# Patient Record
Sex: Male | Born: 1970 | Race: White | Hispanic: No | Marital: Married | Smoking: Never smoker
Health system: Southern US, Community
[De-identification: ages and names within clinical notes are randomized; demographics above are authoritative.]

## PROBLEM LIST (undated history)

## (undated) DIAGNOSIS — K219 Gastro-esophageal reflux disease without esophagitis: Secondary | ICD-10-CM

## (undated) DIAGNOSIS — L989 Disorder of the skin and subcutaneous tissue, unspecified: Secondary | ICD-10-CM

## (undated) DIAGNOSIS — R05 Cough: Secondary | ICD-10-CM

## (undated) DIAGNOSIS — M549 Dorsalgia, unspecified: Secondary | ICD-10-CM

## (undated) DIAGNOSIS — B351 Tinea unguium: Secondary | ICD-10-CM

## (undated) DIAGNOSIS — R062 Wheezing: Secondary | ICD-10-CM

## (undated) DIAGNOSIS — J309 Allergic rhinitis, unspecified: Secondary | ICD-10-CM

## (undated) DIAGNOSIS — H9190 Unspecified hearing loss, unspecified ear: Secondary | ICD-10-CM

## (undated) DIAGNOSIS — B029 Zoster without complications: Secondary | ICD-10-CM

## (undated) DIAGNOSIS — K5289 Other specified noninfective gastroenteritis and colitis: Secondary | ICD-10-CM

## (undated) DIAGNOSIS — R300 Dysuria: Secondary | ICD-10-CM

## (undated) HISTORY — DX: Dorsalgia, unspecified: M54.9

## (undated) HISTORY — DX: Unspecified hearing loss, unspecified ear: H91.90

## (undated) HISTORY — DX: Dysuria: R30.0

## (undated) HISTORY — DX: Tinea unguium: B35.1

## (undated) HISTORY — DX: Cough: R05

## (undated) HISTORY — DX: Allergic rhinitis, unspecified: J30.9

## (undated) HISTORY — DX: Disorder of the skin and subcutaneous tissue, unspecified: L98.9

## (undated) HISTORY — DX: Other specified noninfective gastroenteritis and colitis: K52.89

## (undated) HISTORY — DX: Wheezing: R06.2

## (undated) HISTORY — DX: Gastro-esophageal reflux disease without esophagitis: K21.9

## (undated) HISTORY — DX: Zoster without complications: B02.9

## (undated) HISTORY — PX: OTHER SURGICAL HISTORY: SHX169

---

## 1999-01-11 ENCOUNTER — Emergency Department (HOSPITAL_COMMUNITY): Admission: EM | Admit: 1999-01-11 | Discharge: 1999-01-11 | Payer: Self-pay

## 2007-08-23 ENCOUNTER — Ambulatory Visit: Payer: Self-pay | Admitting: Internal Medicine

## 2007-08-23 LAB — CONVERTED CEMR LAB
ALT: 26 units/L (ref 0–53)
Albumin: 3.7 g/dL (ref 3.5–5.2)
Alkaline Phosphatase: 90 units/L (ref 39–117)
BUN: 11 mg/dL (ref 6–23)
Basophils Absolute: 0 10*3/uL (ref 0.0–0.1)
Basophils Relative: 0.6 % (ref 0.0–1.0)
Calcium: 8.7 mg/dL (ref 8.4–10.5)
Cholesterol: 153 mg/dL (ref 0–200)
Creatinine, Ser: 1 mg/dL (ref 0.4–1.5)
GFR calc Af Amer: 109 mL/min
HDL: 25.6 mg/dL — ABNORMAL LOW (ref >39.0)
Hemoglobin: 15.5 g/dL (ref 13.0–17.0)
LDL Cholesterol: 90 mg/dL (ref 0–99)
MCHC: 34.3 g/dL (ref 30.0–36.0)
Monocytes Absolute: 0.5 10*3/uL (ref 0.2–0.7)
Monocytes Relative: 9 % (ref 3.0–11.0)
Platelets: 211 10*3/uL (ref 150–400)
Potassium: 4.4 meq/L (ref 3.5–5.1)
RBC: 5.04 M/uL (ref 4.22–5.81)
RDW: 12.5 % (ref 11.5–14.6)
TSH: 2.41 microintl units/mL (ref 0.35–5.50)
Total Bilirubin: 1 mg/dL (ref 0.3–1.2)
Total CHOL/HDL Ratio: 6
Triglycerides: 188 mg/dL — ABNORMAL HIGH (ref 0–149)
VLDL: 38 mg/dL (ref 0–40)

## 2007-08-29 ENCOUNTER — Encounter: Payer: Self-pay | Admitting: Internal Medicine

## 2007-08-29 ENCOUNTER — Ambulatory Visit: Payer: Self-pay | Admitting: Internal Medicine

## 2007-08-29 DIAGNOSIS — J309 Allergic rhinitis, unspecified: Secondary | ICD-10-CM

## 2007-08-29 DIAGNOSIS — K219 Gastro-esophageal reflux disease without esophagitis: Secondary | ICD-10-CM

## 2007-08-29 DIAGNOSIS — L0501 Pilonidal cyst with abscess: Secondary | ICD-10-CM

## 2007-08-29 HISTORY — DX: Gastro-esophageal reflux disease without esophagitis: K21.9

## 2007-08-29 HISTORY — DX: Allergic rhinitis, unspecified: J30.9

## 2007-12-05 ENCOUNTER — Telehealth: Payer: Self-pay | Admitting: Internal Medicine

## 2007-12-06 ENCOUNTER — Ambulatory Visit: Payer: Self-pay | Admitting: Internal Medicine

## 2007-12-26 ENCOUNTER — Ambulatory Visit: Payer: Self-pay | Admitting: Internal Medicine

## 2007-12-29 ENCOUNTER — Ambulatory Visit: Payer: Self-pay | Admitting: Internal Medicine

## 2008-01-10 ENCOUNTER — Telehealth (INDEPENDENT_AMBULATORY_CARE_PROVIDER_SITE_OTHER): Payer: Self-pay | Admitting: *Deleted

## 2008-01-11 ENCOUNTER — Encounter (INDEPENDENT_AMBULATORY_CARE_PROVIDER_SITE_OTHER): Payer: Self-pay | Admitting: *Deleted

## 2008-01-27 ENCOUNTER — Ambulatory Visit: Payer: Self-pay | Admitting: Pulmonary Disease

## 2008-01-27 DIAGNOSIS — R05 Cough: Secondary | ICD-10-CM

## 2008-01-27 DIAGNOSIS — R059 Cough, unspecified: Secondary | ICD-10-CM | POA: Insufficient documentation

## 2008-02-10 ENCOUNTER — Ambulatory Visit: Payer: Self-pay | Admitting: Pulmonary Disease

## 2008-05-10 ENCOUNTER — Ambulatory Visit: Payer: Self-pay | Admitting: Internal Medicine

## 2008-05-10 DIAGNOSIS — H9190 Unspecified hearing loss, unspecified ear: Secondary | ICD-10-CM | POA: Insufficient documentation

## 2008-05-10 HISTORY — DX: Unspecified hearing loss, unspecified ear: H91.90

## 2008-07-12 ENCOUNTER — Telehealth (INDEPENDENT_AMBULATORY_CARE_PROVIDER_SITE_OTHER): Payer: Self-pay | Admitting: *Deleted

## 2008-08-29 ENCOUNTER — Encounter: Payer: Self-pay | Admitting: Internal Medicine

## 2008-09-14 ENCOUNTER — Ambulatory Visit: Payer: Self-pay | Admitting: Internal Medicine

## 2008-09-14 DIAGNOSIS — B351 Tinea unguium: Secondary | ICD-10-CM

## 2008-09-14 DIAGNOSIS — R1031 Right lower quadrant pain: Secondary | ICD-10-CM

## 2008-09-14 HISTORY — DX: Tinea unguium: B35.1

## 2008-09-17 ENCOUNTER — Telehealth (INDEPENDENT_AMBULATORY_CARE_PROVIDER_SITE_OTHER): Payer: Self-pay | Admitting: *Deleted

## 2008-09-25 ENCOUNTER — Encounter: Payer: Self-pay | Admitting: Internal Medicine

## 2009-01-08 ENCOUNTER — Ambulatory Visit: Payer: Self-pay | Admitting: Internal Medicine

## 2009-01-08 DIAGNOSIS — M549 Dorsalgia, unspecified: Secondary | ICD-10-CM

## 2009-01-08 DIAGNOSIS — K5289 Other specified noninfective gastroenteritis and colitis: Secondary | ICD-10-CM

## 2009-01-08 HISTORY — DX: Dorsalgia, unspecified: M54.9

## 2009-01-08 HISTORY — DX: Other specified noninfective gastroenteritis and colitis: K52.89

## 2009-01-08 LAB — CONVERTED CEMR LAB
Leukocytes, UA: NEGATIVE
Nitrite: NEGATIVE
Specific Gravity, Urine: >=1.03 (ref 1.000–1.03)
Squamous Epithelial / LPF: NEGATIVE /lpf
Total Protein, Urine: 30 mg/dL — AB
pH: 5.5 (ref 5.0–8.0)

## 2009-01-09 ENCOUNTER — Encounter: Payer: Self-pay | Admitting: Internal Medicine

## 2009-08-01 ENCOUNTER — Encounter: Payer: Self-pay | Admitting: Internal Medicine

## 2009-08-12 ENCOUNTER — Ambulatory Visit: Payer: Self-pay | Admitting: Internal Medicine

## 2009-08-12 LAB — CONVERTED CEMR LAB
Albumin: 3.8 g/dL (ref 3.5–5.2)
Basophils Absolute: 0 10*3/uL (ref 0.0–0.1)
Bilirubin, Direct: 0.1 mg/dL (ref 0.0–0.3)
Chloride: 111 meq/L (ref 96–112)
Cholesterol: 149 mg/dL (ref 0–200)
Eosinophils Absolute: 0.2 10*3/uL (ref 0.0–0.7)
HDL: 27.1 mg/dL — ABNORMAL LOW (ref >39.00)
Hemoglobin: 15.6 g/dL (ref 13.0–17.0)
LDL Cholesterol: 104 mg/dL — ABNORMAL HIGH (ref 0–99)
Leukocytes, UA: NEGATIVE
Lymphocytes Relative: 32 % (ref 12.0–46.0)
MCHC: 33.9 g/dL (ref 30.0–36.0)
Neutro Abs: 3.3 10*3/uL (ref 1.4–7.7)
Neutrophils Relative %: 53.9 % (ref 43.0–77.0)
Platelets: 192 10*3/uL (ref 150.0–400.0)
Potassium: 4.2 meq/L (ref 3.5–5.1)
RDW: 12.3 % (ref 11.5–14.6)
Sodium: 142 meq/L (ref 135–145)
Specific Gravity, Urine: 1.02 (ref 1.000–1.030)
Total Protein: 6.8 g/dL (ref 6.0–8.3)
Triglycerides: 91 mg/dL (ref 0.0–149.0)
Urine Glucose: NEGATIVE mg/dL
Urobilinogen, UA: 0.2 (ref 0.0–1.0)
VLDL: 18.2 mg/dL (ref 0.0–40.0)

## 2009-08-16 ENCOUNTER — Ambulatory Visit: Payer: Self-pay | Admitting: Internal Medicine

## 2009-10-22 ENCOUNTER — Ambulatory Visit: Payer: Self-pay | Admitting: Internal Medicine

## 2009-10-22 DIAGNOSIS — R062 Wheezing: Secondary | ICD-10-CM | POA: Insufficient documentation

## 2009-10-22 LAB — CONVERTED CEMR LAB
Cholesterol, target level: 200 mg/dL
LDL Goal: 160 mg/dL

## 2010-02-26 IMAGING — CR DG CHEST 2V
2 series · 2 of 2 positions shown · non-contrast
Comparison: 12/29/2007

CLINICAL DATA: Cough.  Wheezing.  Short of breath.

CHEST - 2 VIEW

[view not recorded (1 of 2)]
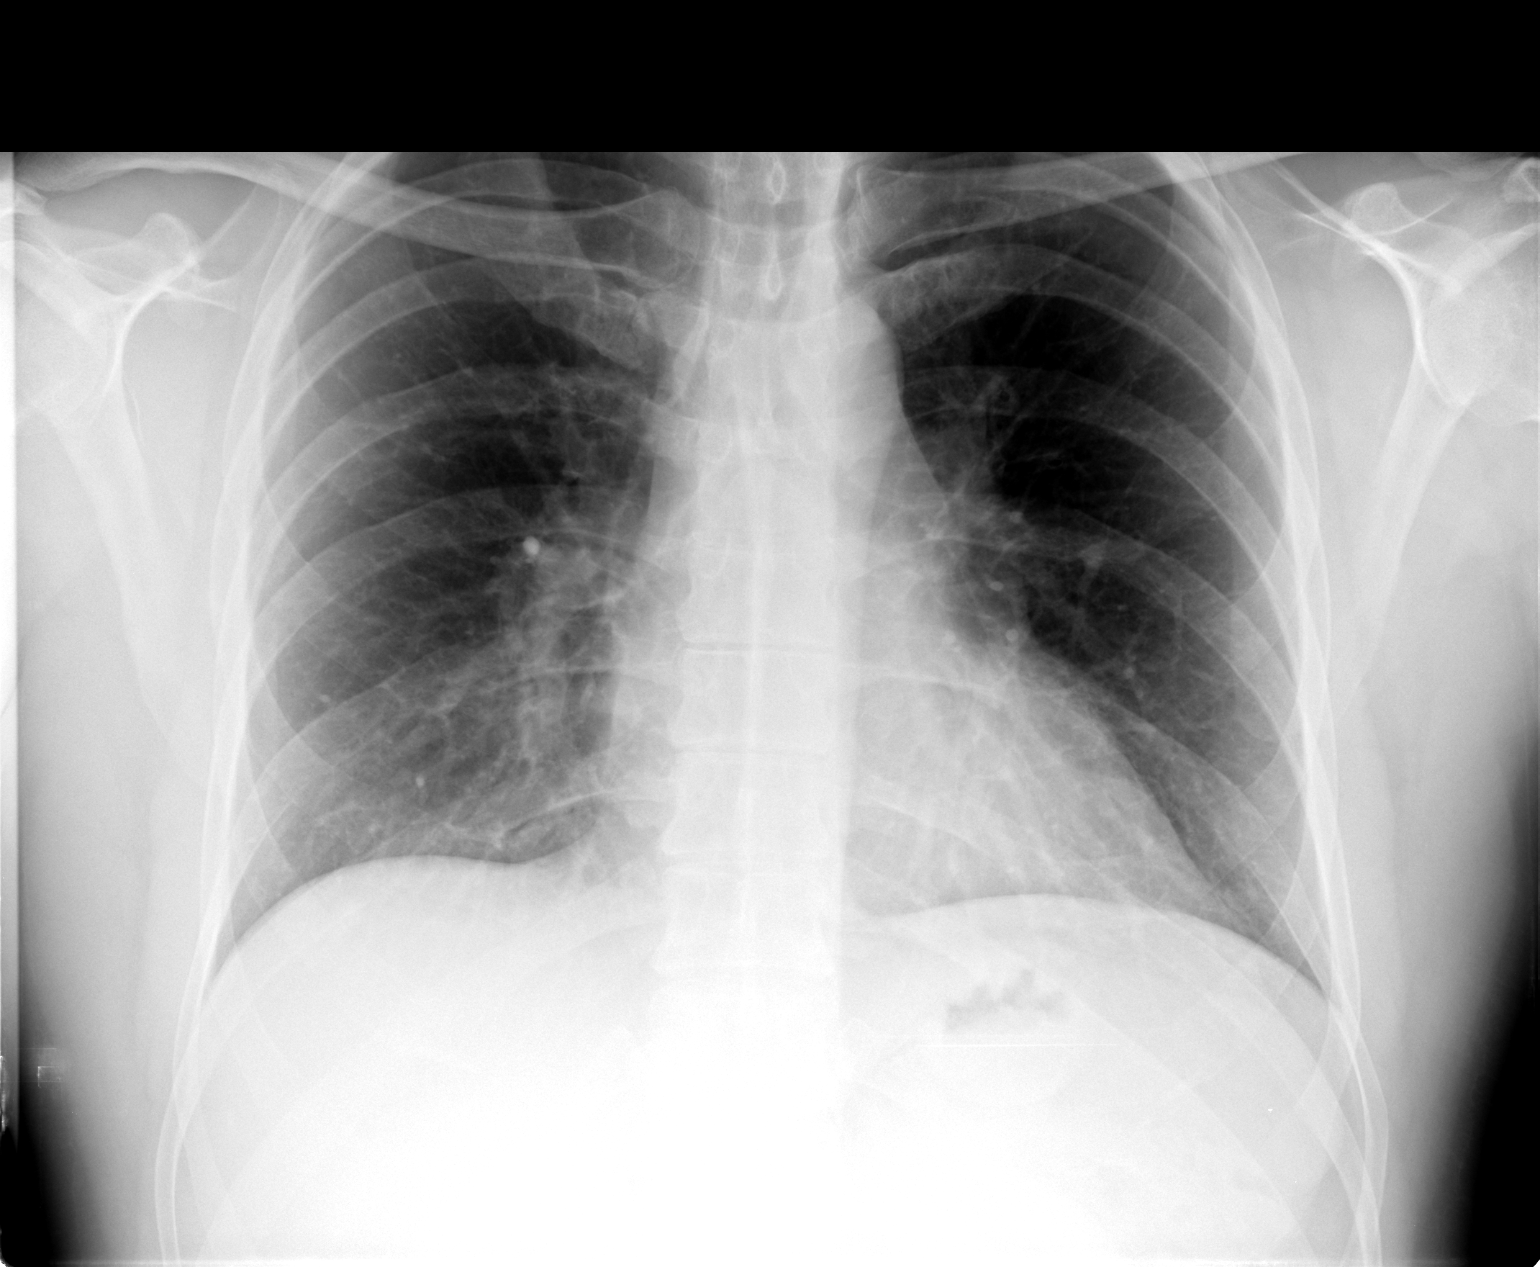

[view not recorded (2 of 2)]
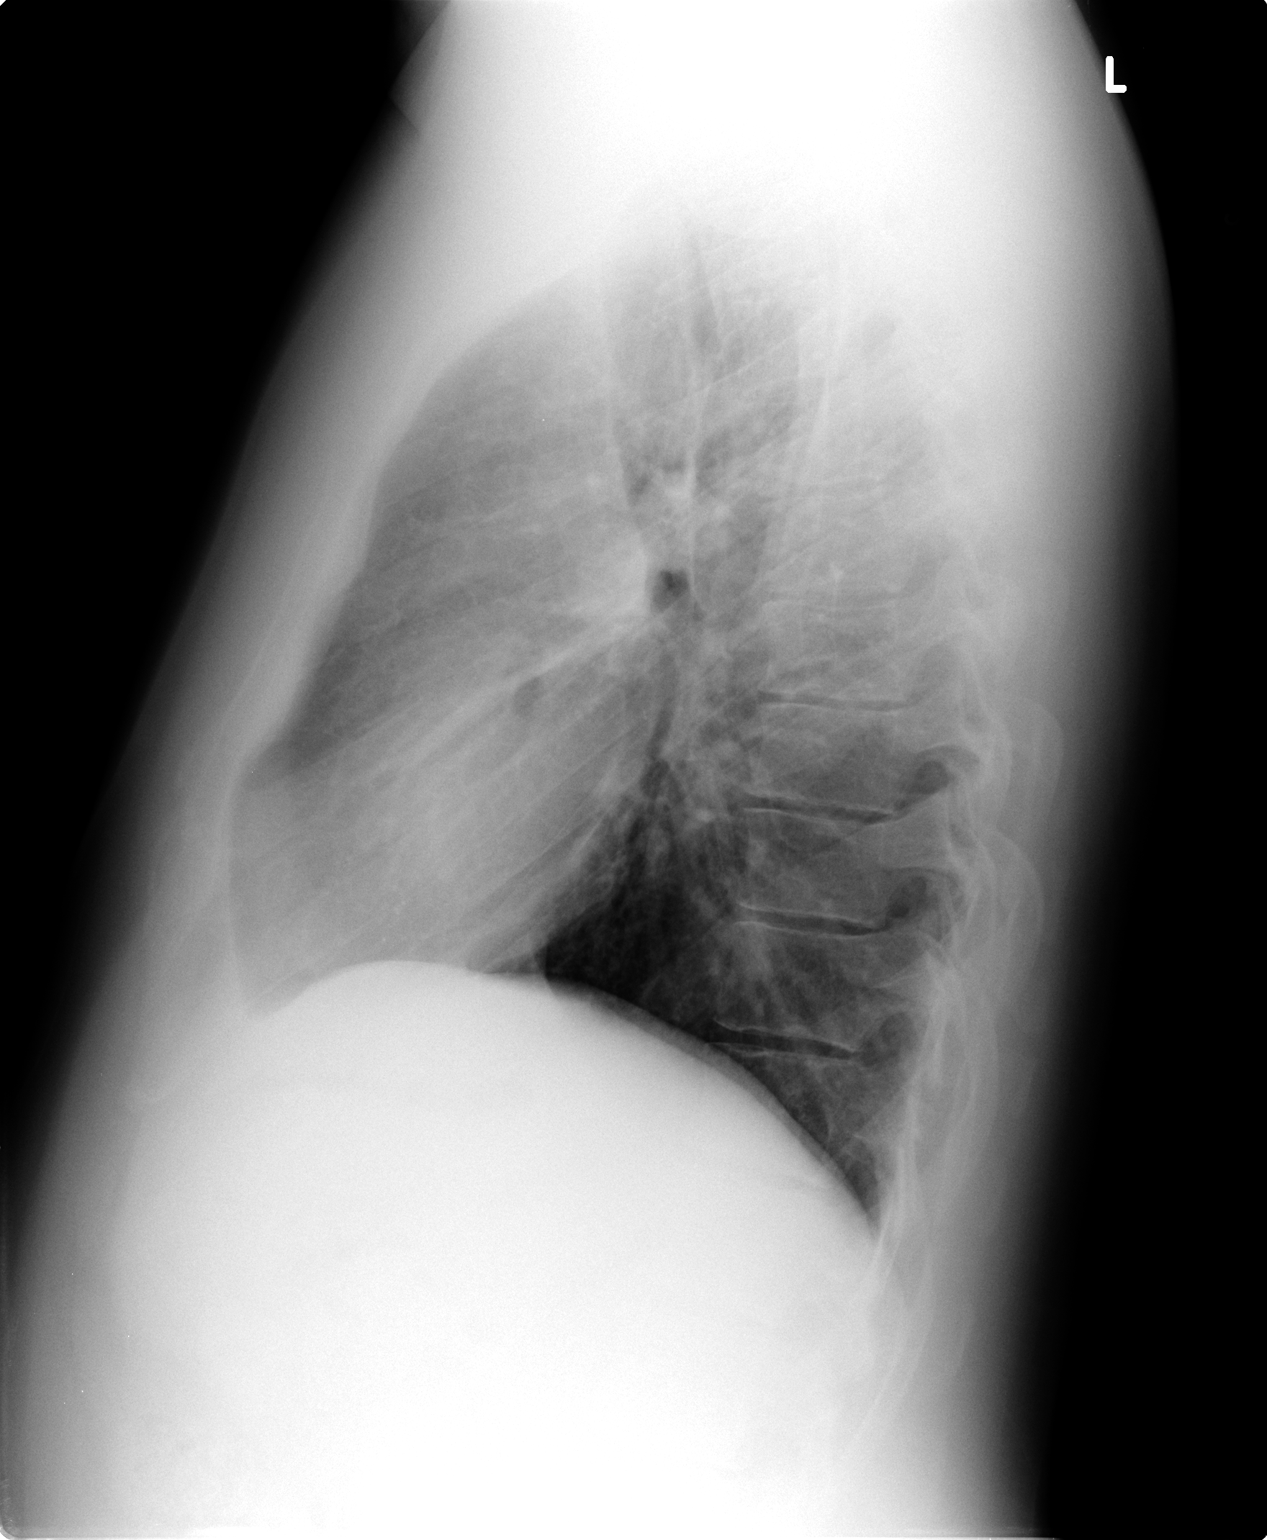

[2 of 2 positions shown; findings below may reference images not displayed]

FINDINGS: The heart size and mediastinal contours are within
normal limits.  Both lungs are clear.  The visualized skeletal
structures are unremarkable.
IMPRESSION: No active cardiopulmonary disease.

## 2010-10-28 ENCOUNTER — Ambulatory Visit: Payer: Self-pay | Admitting: Internal Medicine

## 2010-10-28 DIAGNOSIS — L989 Disorder of the skin and subcutaneous tissue, unspecified: Secondary | ICD-10-CM | POA: Insufficient documentation

## 2010-10-28 HISTORY — DX: Disorder of the skin and subcutaneous tissue, unspecified: L98.9

## 2010-11-24 ENCOUNTER — Ambulatory Visit
Admission: RE | Admit: 2010-11-24 | Discharge: 2010-11-24 | Payer: Self-pay | Source: Home / Self Care | Attending: Internal Medicine | Admitting: Internal Medicine

## 2010-12-18 NOTE — Assessment & Plan Note (Signed)
Summary: last ov 2010,?skin tag,irriation under arm/cd   Vital Signs:  Patient profile:   40 year old male Height:      68 inches Weight:      200 pounds BMI:     30.52 O2 Sat:      97 % on Room air Temp:     97.7 degrees F oral Pulse rate:   57 / minute BP sitting:   100 / 70  (left arm) Cuff size:   regular  Vitals Entered By: Zella Ball Ewing CMA Duncan Dull) (October 28, 2010 11:18 AM)  O2 Flow:  Room air CC: Spot under right arm red and painful/RE   Primary Care Provider:  Oliver Barre  CC:  Spot under right arm red and painful/RE.  History of Present Illness: here with acute onset 3 days pain, red, swelling to cystic type mass to the right axilla, no fever, drainage and no chills.  Area is very small, no prior hx of same, and no other similar areas today.  Pt denies CP, worsening sob, doe, wheezing, orthopnea, pnd, worsening LE edema, palps, dizziness or syncope  Pt denies polydipsia, polyuria.  Overall good compliance with meds.  Preventive Screening-Counseling & Management      Drug Use:  no.    Problems Prior to Update: 1)  Skin Lesion  (ICD-709.9) 2)  Wheezing  (ICD-786.07) 3)  Preventive Health Care  (ICD-V70.0) 4)  Back Pain  (ICD-724.5) 5)  Gastroenteritis, Acute  (ICD-558.9) 6)  Hepatotoxicity, Drug-induced, Risk of  (ICD-V58.69) 7)  Onychomycosis, Toenails  (ICD-110.1) 8)  Rlq Pain  (ICD-789.03) 9)  Hearing Loss  (ICD-389.9) 10)  Cough  (ICD-786.2) 11)  Preventive Health Care  (ICD-V70.0) 12)  Family History Depression  (ICD-V17.0) 13)  Family History of Cad Male 1st Degree Relative <50  (ICD-V17.3) 14)  Cyst, Pilonidal W/abscess  (ICD-685.0) 15)  Allergic Rhinitis  (ICD-477.9) 16)  Gerd  (ICD-530.81)  Medications Prior to Update: 1)  Promethazine Hcl 25 Mg Tabs (Promethazine Hcl) .Marland Kitchen.. 1 By Mouth Q 6 Hrs As Needed Nausea 2)  Azithromycin 250 Mg Tabs (Azithromycin) .... 2po Qd For 1 Day, Then 1po Qd For 4days, Then Stop 3)  Hydrocodone-Homatropine 5-1.5 Mg/48ml  Syrp (Hydrocodone-Homatropine) .Marland Kitchen.. 1 Tsp By Mouth Q 6 Hrs As Needed 4)  Prednisone 10 Mg Tabs (Prednisone) .... 3po Qd For 3days, Then 2po Qd For 3days, Then 1po Qd For 3days, Then Stop 5)  Fexofenadine Hcl 180 Mg Tabs (Fexofenadine Hcl) .Marland Kitchen.. 1 By Mouth Once Daily As Needed Allergy  Current Medications (verified): 1)  Fexofenadine Hcl 180 Mg Tabs (Fexofenadine Hcl) .Marland Kitchen.. 1 By Mouth Once Daily As Needed Allergy 2)  Doxycycline Hyclate 100 Mg Caps (Doxycycline Hyclate) .Marland Kitchen.. 1 By Mouth Two Times A Day  Allergies (verified): No Known Drug Allergies  Past History:  Past Medical History: Last updated: 05/10/2008 CYST, PILONIDAL W/ABSCESS (ICD-685.0) ALLERGIC RHINITIS (ICD-477.9) GERD (ICD-530.81)    Past Surgical History: Last updated: 05/10/2008 pilonidal cystectomy  Social History: Last updated: 10/28/2010 Never Smoked Alcohol use-yes pt is married. 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  no  ill appearing Head:  normocephalic and atraumatic.   Eyes:  vision grossly intact, pupils equal, and pupils round.   Ears:  R ear normal and L ear normal.   Nose:  no external deformity and no nasal discharge.   Mouth:  no gingival abnormalities and pharynx pink and moist.   Neck:  supple and no masses.   Lungs:  normal respiratory effort and normal breath sounds.   Heart:  normal rate and regular rhythm.   Extremities:  no edema, no erythema  Axillary Nodes:  right axilla with subq nodular mass, mild tender, nonfluctuant, no drainage with mild surrounding swelling/erythema < .5 cm   Impression & Recommendations:  Problem #  1:  SKIN LESION (ICD-709.9) Assessment Deteriorated skin tag with assoc cystic mass subq - ? inflamed/infected - for doxy course, refer derm  Orders: Dermatology Referral (Derma)  Complete Medication List: 1)  Fexofenadine Hcl 180 Mg Tabs (Fexofenadine hcl) .Marland Kitchen.. 1 by mouth once daily as needed allergy 2)  Doxycycline Hyclate 100 Mg Caps (Doxycycline hyclate) .Marland Kitchen.. 1 by mouth two times a day  Patient Instructions: 1)  Please take all new medications as prescribed 2)  Continue all previous medications as before this visit  3)  You will be contacted about the referral(s) to: dermatology 4)  Please schedule a follow-up appointment as needed. Prescriptions: DOXYCYCLINE HYCLATE 100 MG CAPS (DOXYCYCLINE HYCLATE) 1 by mouth two times a day  #20 x 0   Entered and Authorized by:   Corwin Levins MD   Signed by:   Corwin Levins MD on 10/28/2010   Method used:   Print then Give to Patient   RxID:   [id number]    Orders Added: 1)  Dermatology Referral [Derma] 2)  Est. Patient Level III [69629]

## 2010-12-18 NOTE — Assessment & Plan Note (Signed)
Summary: COUGH--BREATH'G DIFFICULT  ---STC   Vital Signs:  Patient profile:   40 year old male Height:      68 inches Weight:      203.50 pounds BMI:     31.05 O2 Sat:      97 % on Room air Temp:     98.4 degrees F oral Pulse rate:   76 / minute BP sitting:   130 / 82  (left arm) Cuff size:   large  Vitals Entered By: Zella Ball Ewing CMA (AAMA) (November 24, 2010 2:03 PM)  O2 Flow:  Room air CC: Cough, Difficult to Breathe/RE   Primary Care Provider:  Oliver Barre  CC:  Cough and Difficult to Breathe/RE.  History of Present Illness: here with acute onset 2-3 days fever, headache, mild ST, non prod cough and mild wheezing/sib;  Pt denies CP, doe, orthopnea, pnd, worsening LE edema, palps, dizziness or syncope  Pt denies new neuro symptoms such as facial or extremity weakness . Pt denies polydipsia, polyuria   Overall good compliance with meds, trying to follow low chol  diet, wt stable, little excercise however  No recent wt loss, night sweats, loss of appetite or other constitutional symptoms .  Overall good compliance with meds, and good tolerability. Has stable allergy symptoms on current meds without signficant itch, sneeze or congesiton.  No sinus pain , pressure or d/c.  Has occasional GERD symptoms with sour brash, but overall mild, some due to dietary indiscretion, but no dysphagai, wt loss, abd pain, bowel change or blood.   Problems Prior to Update: 1)  Wheezing  (ICD-786.07) 2)  Bronchitis-acute  (ICD-466.0) 3)  Skin Lesion  (ICD-709.9) 4)  Wheezing  (ICD-786.07) 5)  Preventive Health Care  (ICD-V70.0) 6)  Back Pain  (ICD-724.5) 7)  Gastroenteritis, Acute  (ICD-558.9) 8)  Hepatotoxicity, Drug-induced, Risk of  (ICD-V58.69) 9)  Onychomycosis, Toenails  (ICD-110.1) 10)  Rlq Pain  (ICD-789.03) 11)  Hearing Loss  (ICD-389.9) 12)  Cough  (ICD-786.2) 13)  Preventive Health Care  (ICD-V70.0) 14)  Family History Depression  (ICD-V17.0) 15)  Family History of Cad Male 1st Degree  Relative <50  (ICD-V17.3) 16)  Cyst, Pilonidal W/abscess  (ICD-685.0) 17)  Allergic Rhinitis  (ICD-477.9) 18)  Gerd  (ICD-530.81)  Medications Prior to Update: 1)  Fexofenadine Hcl 180 Mg Tabs (Fexofenadine Hcl) .Marland Kitchen.. 1 By Mouth Once Daily As Needed Allergy 2)  Doxycycline Hyclate 100 Mg Caps (Doxycycline Hyclate) .Marland Kitchen.. 1 By Mouth Two Times A Day  Current Medications (verified): 1)  Fexofenadine Hcl 180 Mg Tabs (Fexofenadine Hcl) .Marland Kitchen.. 1 By Mouth Once Daily As Needed Allergy 2)  Azithromycin 250 Mg Tabs (Azithromycin) .... 2po Qd For 1 Day, Then 1po Qd For 4days, Then Stop 3)  Prednisone 10 Mg Tabs (Prednisone) .... 3po Qd For 3days, Then 2po Qd For 3days, Then 1po Qd For 3days, Then Stop 4)  Tussionex Pennkinetic Er 10-8 Mg/6ml Lqcr (Hydrocod Polst-Chlorphen Polst) .Marland Kitchen.. 1 Tsp By Mouth Two Times A Day As Needed  Allergies (verified): No Known Drug Allergies  Past History:  Past Medical History: Last updated: 05/10/2008 CYST, PILONIDAL W/ABSCESS (ICD-685.0) ALLERGIC RHINITIS (ICD-477.9) GERD (ICD-530.81)    Past Surgical History: Last updated: 05/10/2008 pilonidal cystectomy  Social History: Last updated: 10/28/2010 Never Smoked Alcohol use-yes pt is married. 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  mild ill appearing Head:  normocephalic and atraumatic.   Eyes:  vision grossly intact, pupils equal, and pupils round.   Ears:  bilat tm's red, sinus tender Nose:  nasal dischargemucosal pallor and mucosal edema.   Mouth:  pharyngeal erythema and fair dentition.   Neck:  supple and cervical lymphadenopathy.   Lungs:  normal respiratory effort, R decreased breath sounds, R wheezes, L decreased breath sounds, and L wheezes.   Heart:  normal rate and regular rhythm.     Abdomen:  soft, non-tender, and normal bowel sounds.   Extremities:  no edema, no erythema    Impression & Recommendations:  Problem # 1:  BRONCHITIS-ACUTE (ICD-466.0)  His updated medication list for this problem includes:    Azithromycin 250 Mg Tabs (Azithromycin) .Marland Kitchen... 2po qd for 1 day, then 1po qd for 4days, then stop    Tussionex Pennkinetic Er 10-8 Mg/8ml Lqcr (Hydrocod polst-chlorphen polst) .Marland Kitchen... 1 tsp by mouth two times a day as needed treat as above, f/u any worsening signs or symptoms   Problem # 2:  WHEEZING (ICD-786.07) mild, likely due to bronchospasm related to #1, for predpack for home; f/u any worsening symptoms  Problem # 3:  ALLERGIC RHINITIS (ICD-477.9)  His updated medication list for this problem includes:    Fexofenadine Hcl 180 Mg Tabs (Fexofenadine hcl) .Marland Kitchen... 1 by mouth once daily as needed allergy stable overall by hx and exam, ok to continue meds/tx as is   Problem # 4:  GERD (ICD-530.81) mild symptomatic, declines rx PPI, prefers anti-refux precautions and oTC PPI as needed   Complete Medication List: 1)  Fexofenadine Hcl 180 Mg Tabs (Fexofenadine hcl) .Marland Kitchen.. 1 by mouth once daily as needed allergy 2)  Azithromycin 250 Mg Tabs (Azithromycin) .... 2po qd for 1 day, then 1po qd for 4days, then stop 3)  Prednisone 10 Mg Tabs (Prednisone) .... 3po qd for 3days, then 2po qd for 3days, then 1po qd for 3days, then stop 4)  Tussionex Pennkinetic Er 10-8 Mg/31ml Lqcr (Hydrocod polst-chlorphen polst) .Marland Kitchen.. 1 tsp by mouth two times a day as needed  Patient Instructions: 1)  Please take all new medications as prescribed 2)  Continue all previous medications as before this visit  3)  Please schedule a follow-up appointment in Oct 2012 for CPX with labs Prescriptions: TUSSIONEX PENNKINETIC ER 10-8 MG/5ML LQCR (HYDROCOD POLST-CHLORPHEN POLST) 1 tsp by mouth two times a day as needed  #6oz x 1   Entered and Authorized by:   Corwin Levins MD   Signed by:   Corwin Levins  MD on 11/24/2010   Method used:   Print then Give to Patient   RxID:   [id number] PREDNISONE 10 MG TABS (PREDNISONE) 3po qd for 3days, then 2po qd for 3days, then 1po qd for 3days, then stop  #18 x 0   Entered and Authorized by:   Corwin Levins MD   Signed by:   Corwin Levins MD on 11/24/2010   Method used:   Print then Give to Patient   RxID:   [id number] AZITHROMYCIN 250 MG TABS (AZITHROMYCIN) 2po qd for 1 day, then 1po qd for 4days, then stop  #6 x 1   Entered and Authorized by:   Corwin Levins MD   Signed by:   Corwin Levins MD on 11/24/2010   Method used:   Print then Give  to Patient   RxID:   [id number]    Orders Added: 1)  Est. Patient Level IV [14782]

## 2011-01-19 ENCOUNTER — Other Ambulatory Visit: Payer: Self-pay | Admitting: Internal Medicine

## 2011-01-19 ENCOUNTER — Ambulatory Visit (INDEPENDENT_AMBULATORY_CARE_PROVIDER_SITE_OTHER): Payer: BC Managed Care – PPO | Admitting: Internal Medicine

## 2011-01-19 ENCOUNTER — Encounter: Payer: Self-pay | Admitting: Internal Medicine

## 2011-01-19 ENCOUNTER — Other Ambulatory Visit: Payer: BC Managed Care – PPO

## 2011-01-19 DIAGNOSIS — R5381 Other malaise: Secondary | ICD-10-CM

## 2011-01-19 DIAGNOSIS — R300 Dysuria: Secondary | ICD-10-CM | POA: Insufficient documentation

## 2011-01-19 DIAGNOSIS — R5383 Other fatigue: Secondary | ICD-10-CM

## 2011-01-19 DIAGNOSIS — R599 Enlarged lymph nodes, unspecified: Secondary | ICD-10-CM

## 2011-01-19 LAB — URINALYSIS, ROUTINE W REFLEX MICROSCOPIC
Ketones, ur: NEGATIVE
Specific Gravity, Urine: 1.025 (ref 1.000–1.030)
Total Protein, Urine: NEGATIVE
Urine Glucose: NEGATIVE
Urobilinogen, UA: 0.2 (ref 0.0–1.0)

## 2011-01-19 LAB — BASIC METABOLIC PANEL
BUN: 15 mg/dL (ref 6–23)
Calcium: 8.6 mg/dL (ref 8.4–10.5)
Creatinine, Ser: 0.8 mg/dL (ref 0.4–1.5)
GFR: 111.5 mL/min (ref >60.00)

## 2011-01-19 LAB — HEPATIC FUNCTION PANEL
ALT: 28 U/L (ref 0–53)
AST: 29 U/L (ref 0–37)
Bilirubin, Direct: 0.1 mg/dL (ref 0.0–0.3)
Total Bilirubin: 0.4 mg/dL (ref 0.3–1.2)

## 2011-01-19 LAB — CBC WITH DIFFERENTIAL/PLATELET
Eosinophils Relative: 2.4 % (ref 0.0–5.0)
Monocytes Relative: 10.7 % (ref 3.0–12.0)
Neutrophils Relative %: 64.8 % (ref 43.0–77.0)
Platelets: 196 10*3/uL (ref 150.0–400.0)
WBC: 8.1 10*3/uL (ref 4.5–10.5)

## 2011-01-20 ENCOUNTER — Ambulatory Visit: Payer: Self-pay | Admitting: Internal Medicine

## 2011-01-20 ENCOUNTER — Encounter: Payer: Self-pay | Admitting: Internal Medicine

## 2011-01-20 LAB — CONVERTED CEMR LAB
Chlamydia, Swab/Urine, PCR: NEGATIVE
EBV VCA IgG: 2.21 — ABNORMAL HIGH
GC Probe Amp, Urine: NEGATIVE

## 2011-01-22 ENCOUNTER — Telehealth: Payer: Self-pay | Admitting: Internal Medicine

## 2011-01-27 NOTE — Progress Notes (Signed)
Summary: Mono lab?  Phone Note Call from Patient Call back at [facility] Phone [phone]   Caller: Patient Summary of Call: Pt called stating he is unclear of Mono test results. He says his wife is now feeling the same way he felt, body aches and fatigue. Pt is concerned that although lab results said Mono was not a current infection, he has passed virus to his wife. Please advise, is pt contagious? Initial call taken by: Margaret Pyle, CMA,  January 22, 2011 11:28 AM  Follow-up for Phone Call        no, pt not contagious for mono - labs showed evidence of remote prior mono infx (over 3 months ago, probably years ago) - however, it is possible wife now has same infx (unspecified viral syndrome).  Follow-up by: Newt Lukes MD,  January 22, 2011 11:32 AM  Additional Follow-up for Phone Call Additional follow up Details #1::        Pt advised of above via VM as requested Additional Follow-up by: Margaret Pyle, CMA,  January 22, 2011 1:55 PM

## 2011-01-27 NOTE — Assessment & Plan Note (Signed)
Summary: DR Sammuel Cooper PT/NO SLOT--BODY ACHES-SWOLLEN NECK GLANDS-EAR CONGEST...   Vital Signs:  Patient profile:   40 year old male O2 Sat:      97 % on Room air Temp:     98.3 degrees F (36.83 degrees C) oral Pulse rate:   84 / minute BP sitting:   100 / 72  (left arm) Cuff size:   large  Vitals Entered By: Orlan Leavens RMA (January 19, 2011 1:21 PM)  O2 Flow:  Room air CC: Swollen glands/ Bodyaches, URI symptoms Is Patient Diabetic? No Pain Assessment Patient in pain? yes     Location: body Type: aching   Primary Care Provider:  Oliver Barre  CC:  Swollen glands/ Bodyaches and URI symptoms.  History of Present Illness:  URI Symptoms      This is a 40 year old man who presents with URI symptoms.  The symptoms began 3 days ago.  The severity is described as moderate.  swolllen neck glands very tender to touch. improved symptoms with ibuprofen but rapidly progressive fatigue.  The patient reports sore throat, earache, and sick contacts, but denies nasal congestion, clear nasal discharge, dry cough, and productive cough.  Associated symptoms include low-grade fever (<100.5 degrees).  The patient denies stiff neck, dyspnea, and wheezing.  The patient also reports muscle aches and severe fatigue.  The patient denies itchy watery eyes, itchy throat, sneezing, seasonal symptoms, response to antihistamine, and headache.  Risk factors for Strep sinusitis include Strep exposure, tender adenopathy, and absence of cough.  The patient denies the following risk factors for Strep sinusitis: unilateral facial pain, unilateral nasal discharge, double sickening, and tooth pain.    Clinical Review Panels:  Lipid Management   Cholesterol:  149 (08/12/2009)   LDL (bad choesterol):  104 (08/12/2009)   HDL (good cholesterol):  27.10 (08/12/2009)  CBC   WBC:  5.9 (08/12/2009)   RBC:  5.08 (08/12/2009)   Hgb:  15.6 (08/12/2009)   Hct:  46.0 (08/12/2009)   Platelets:  192.0 (08/12/2009)   MCV  90.5  (08/12/2009)   MCHC  33.9 (08/12/2009)   RDW  12.3 (08/12/2009)   PMN:  53.9 (08/12/2009)   Lymphs:  32.0 (08/12/2009)   Monos:  9.2 (08/12/2009)   Eosinophils:  4.1 (08/12/2009)   Basophil:  0.8 (08/12/2009)  Complete Metabolic Panel   Glucose:  82 (08/12/2009)   Sodium:  142 (08/12/2009)   Potassium:  4.2 (08/12/2009)   Chloride:  111 (08/12/2009)   CO2:  27 (08/12/2009)   BUN:  16 (08/12/2009)   Creatinine:  1.0 (08/12/2009)   Albumin:  3.8 (08/12/2009)   Total Protein:  6.8 (08/12/2009)   Calcium:  8.4 (08/12/2009)   Total Bili:  0.9 (08/12/2009)   Alk Phos:  92 (08/12/2009)   SGPT (ALT):  28 (08/12/2009)   SGOT (AST):  26 (08/12/2009)   Current Medications (verified): 1)  Fexofenadine Hcl 180 Mg Tabs (Fexofenadine Hcl) .Marland Kitchen.. 1 By Mouth Once Daily As Needed Allergy  Allergies (verified): No Known Drug Allergies  Past History:  Past Medical History: CYST, PILONIDAL W/ABSCESS  ALLERGIC RHINITIS  GERD    Review of Systems  The patient denies chest pain, syncope, peripheral edema, muscle weakness, and suspicious skin lesions.         also c/o dysuria  Physical Exam  General:  alert and overweight-appearing.  mild ill appearing Head:  Normocephalic and atraumatic without obvious abnormalities. No apparent alopecia or balding. Eyes:  vision grossly intact, pupils  equal, and pupils round.   Ears:  L ear with cerumen, R ear clear Mouth:  teeth and gums in good repair; mucous membranes moist, without lesions or ulcers. oropharynx clear without exudate, mild erythema. enlarged tonsilds with crypts R>L side Neck:  cervical R>L LAD Lungs:  normal respiratory effort, no intercostal retractions or use of accessory muscles; normal breath sounds bilaterally - no crackles and no wheezes.    Heart:  normal rate and regular rhythm.   Abdomen:  soft, non-tender, normal bowel sounds, no distention; no masses and no appreciable hepatomegaly or splenomegaly.   Msk:  No deformity  or scoliosis noted of thoracic or lumbar spine.  no joint swelling Skin:  no rashes, vesicles, ulcers, or erythema. No nodules or irregularity to palpation.    Impression & Recommendations:  Problem # 1:  FATIGUE (ICD-780.79)  a/w cervical LAD and arthralgia - suspect viral syndrome - check labs and throat cx now - also UA given dysuria hold abx unless source of bact infx identified f/u PCP in 2 weeks if unimproved, sooner if worse  Orders: TLB-BMP (Basic Metabolic Panel-BMET) (80048-METABOL) TLB-CBC Platelet - w/Differential (85025-CBCD) TLB-Hepatic/Liver Function Pnl (80076-HEPATIC) TLB-TSH (Thyroid Stimulating Hormone) (84443-TSH)  Problem # 2:  CERVICAL LYMPHADENOPATHY (ICD-785.6)  see fatigue above The following medications were removed from the medication list:    Azithromycin 250 Mg Tabs (Azithromycin) .Marland Kitchen... 2po qd for 1 day, then 1po qd for 4days, then stop  Orders: TLB-BMP (Basic Metabolic Panel-BMET) (80048-METABOL) TLB-CBC Platelet - w/Differential (85025-CBCD) TLB-Hepatic/Liver Function Pnl (80076-HEPATIC) TLB-TSH (Thyroid Stimulating Hormone) (84443-TSH) T-Culture, Throat (62952-84132) T-Epstein Barr Virus Antibody Panel I ([id number])  Problem # 3:  DYSURIA (ICD-788.1)  The following medications were removed from the medication list:    Azithromycin 250 Mg Tabs (Azithromycin) .Marland Kitchen... 2po qd for 1 day, then 1po qd for 4days, then stop  Orders: T-GC Probe, urine (66440-34742) T-Chlamydia  Probe, urine ([id number]) TLB-Udip w/ Micro (81001-URINE) T-Culture, Urine ([id number])  Encouraged to push clear liquids, get enough rest, and take acetaminophen as needed. To be seen in 10 days if no improvement, sooner if worse.  Complete Medication List: 1)  Fexofenadine Hcl 180 Mg Tabs (Fexofenadine hcl) .Marland Kitchen.. 1 by mouth once daily as needed allergy  Patient Instructions: 1)  it was good to see you today. 2)  test(s) ordered today - your results will be called to  you after review in 48-72 hours from the time of test completion; if any changes need to be made or there are abnormal results, you will be notified at that time 3)  if you develop worsening symptoms or fever, call us and we can reconsider antibiotics but it does not appear necessary to use any anitbiotic at this time  4)  Get plenty of rest, drink lots of clear liquids, and use Tylenol or Ibuprofen for fever and comfort. Return in 7-10 days if you're not better:sooner if you're feeling worse.   Orders Added: 1)  Est. Patient Level IV [66063] 2)  TLB-BMP (Basic Metabolic Panel-BMET) [80048-METABOL] 3)  TLB-CBC Platelet - w/Differential [85025-CBCD] 4)  TLB-Hepatic/Liver Function Pnl [80076-HEPATIC] 5)  TLB-TSH (Thyroid Stimulating Hormone) [84443-TSH] 6)  T-Culture, Throat [[id number]] 7)  T-GC Probe, urine [phone] 8)  T-Chlamydia  Probe, urine [[id number]] 9)  TLB-Udip w/ Micro [81001-URINE] 10)  T-Culture, Urine [[id number]] 11)  T-Epstein Barr Virus Antibody Panel I [phone]

## 2011-02-02 ENCOUNTER — Ambulatory Visit (INDEPENDENT_AMBULATORY_CARE_PROVIDER_SITE_OTHER): Payer: BC Managed Care – PPO | Admitting: Internal Medicine

## 2011-02-02 ENCOUNTER — Encounter: Payer: Self-pay | Admitting: Internal Medicine

## 2011-02-02 DIAGNOSIS — J069 Acute upper respiratory infection, unspecified: Secondary | ICD-10-CM

## 2011-02-02 DIAGNOSIS — K219 Gastro-esophageal reflux disease without esophagitis: Secondary | ICD-10-CM

## 2011-02-02 DIAGNOSIS — J309 Allergic rhinitis, unspecified: Secondary | ICD-10-CM

## 2011-02-12 NOTE — Assessment & Plan Note (Signed)
Summary: recurr virus//cd   Vital Signs:  Patient profile:   39 year old male Height:      68 inches Weight:      201.13 pounds BMI:     30.69 O2 Sat:      97 % on Room air Temp:     98.7 degrees F oral Pulse rate:   92 / minute BP sitting:   102 / 72  (left arm) Cuff size:   regular  Vitals Entered By: Zella Ball Ewing CMA Duncan Dull) (February 02, 2011 2:45 PM)  O2 Flow:  Room air CC: Body aches, sinus congestion/RE   Primary Care Provider:  Oliver Barre  CC:  Body aches and sinus congestion/RE.  History of Present Illness: here with recent URI symptoms now improved- No fever, sT, sinus pain, colored d/c,  HA  also with dysuria on two episodes over the past 2 wks, resolved currently without abd pan, flank pain, freq, urgency or hematuria  also with ongoing allergy symtpoms, with post nasal gtt and mild sob/doe with onset simply walking outdoors in the spring mostly;  has been so bad in the past wk he 's shoked on the mucous , coughed and gagged and has lunch regurgitated, and some reflux assoc it seems.  ;  also menitons milder symtpoms year round with sinus congestion , just worse in the spring. Pt denies CP, worsening sob, doe, wheezing, orthopnea, pnd, worsening LE edema, palps, dizziness or syncope  Pt denies new neuro symptoms such as headache, facial or extremity weakness  Pt denies polydipsia, polyuria, dysphgia, n/v, abd pain, bowel change or blood.    Problems Prior to Update: 1)  Uri  (ICD-465.9) 2)  Dysuria  (ICD-788.1) 3)  Cervical Lymphadenopathy  (ICD-785.6) 4)  Fatigue  (ICD-780.79) 5)  Wheezing  (ICD-786.07) 6)  Skin Lesion  (ICD-709.9) 7)  Wheezing  (ICD-786.07) 8)  Preventive Health Care  (ICD-V70.0) 9)  Back Pain  (ICD-724.5) 10)  Gastroenteritis, Acute  (ICD-558.9) 11)  Hepatotoxicity, Drug-induced, Risk of  (ICD-V58.69) 12)  Onychomycosis, Toenails  (ICD-110.1) 13)  Rlq Pain  (ICD-789.03) 14)  Hearing Loss  (ICD-389.9) 15)  Cough  (ICD-786.2) 16)  Preventive  Health Care  (ICD-V70.0) 17)  Family History Depression  (ICD-V17.0) 18)  Family History of Cad Male 1st Degree Relative <50  (ICD-V17.3) 19)  Cyst, Pilonidal W/abscess  (ICD-685.0) 20)  Allergic Rhinitis  (ICD-477.9) 21)  Gerd  (ICD-530.81)  Medications Prior to Update: 1)  Fexofenadine Hcl 180 Mg Tabs (Fexofenadine Hcl) .Marland Kitchen.. 1 By Mouth Once Daily As Needed Allergy  Current Medications (verified): 1)  Fexofenadine Hcl 180 Mg Tabs (Fexofenadine Hcl) .Marland Kitchen.. 1 By Mouth Once Daily As Needed Allergy 2)  Fluticasone Propionate 50 Mcg/act Susp (Fluticasone Propionate) .... 2 Spray/side Once Daily  Allergies (verified): No Known Drug Allergies  Past History:  Past Medical History: Last updated: 01/19/2011 CYST, PILONIDAL W/ABSCESS  ALLERGIC RHINITIS  GERD    Past Surgical History: Last updated: 05/10/2008 pilonidal cystectomy  Social History: Last updated: 10/28/2010 Never Smoked Alcohol use-yes pt is married. 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  Head:  Normocephalic and atraumatic without obvious abnormalities. No apparent alopecia or balding. Eyes:  vision grossly intact, pupils equal, and pupils round.   Ears:  R ear normal and L ear normal.   Nose:  nasal dischargemucosal pallor and mucosal edema.   Mouth:  pharyngeal erythema and fair dentition.   Neck:  supple and no masses.   Lungs:  normal respiratory effort and normal breath sounds.   Heart:  normal rate and regular rhythm.   Extremities:  no edema, no erythema    Impression & Recommendations:  Problem # 1:  ALLERGIC RHINITIS (ICD-477.9)  His updated medication list for this problem includes:    Fexofenadine Hcl 180 Mg Tabs (Fexofenadine hcl) .Marland Kitchen... 1 by mouth once daily as needed allergy     Fluticasone Propionate 50 Mcg/act Susp (Fluticasone propionate) .Marland Kitchen... 2 spray/side once daily  Orders: Depo- Medrol 40mg  (J1030) Depo- Medrol 80mg  (J1040) Admin of Therapeutic Inj  intramuscular or subcutaneous (28413) moderate symtpoms,  for depomedrol IM, treat as above, f/u any worsening signs or symptoms   Problem # 2:  GERD (ICD-530.81)  for OTC prilosec as needed   Labs Reviewed: Hgb: 16.0 (01/19/2011)   Hct: 45.7 (01/19/2011)  Problem # 3:  URI (ICD-465.9)  His updated medication list for this problem includes:    Fexofenadine Hcl 180 Mg Tabs (Fexofenadine hcl) .Marland Kitchen... 1 by mouth once daily as needed allergy resolved - treat as above, f/u any worsening signs or symptoms , also for mucinex as needed   Complete Medication List: 1)  Fexofenadine Hcl 180 Mg Tabs (Fexofenadine hcl) .Marland Kitchen.. 1 by mouth once daily as needed allergy 2)  Fluticasone Propionate 50 Mcg/act Susp (Fluticasone propionate) .... 2 spray/side once daily  Patient Instructions: 1)  you had the steroid shot today 2)  Please take all new medications as prescribed  3)  Continue all previous medications as before this visit  4)  Please schedule a follow-up appointment in 6 months for CPX with labs Prescriptions: FLUTICASONE PROPIONATE 50 MCG/ACT SUSP (FLUTICASONE PROPIONATE) 2 spray/side once daily  #1 x 11   Entered and Authorized by:   Corwin Levins MD   Signed by:   Corwin Levins MD on 02/02/2011   Method used:   Print then Give to Patient   RxID:   [id number] FEXOFENADINE HCL 180 MG TABS (FEXOFENADINE HCL) 1 by mouth once daily as needed allergy  #30 x 11   Entered and Authorized by:   Corwin Levins MD   Signed by:   Corwin Levins MD on 02/02/2011   Method used:   Print then Give to Patient   RxID:   [id number]    Medication Administration  Injection # 1:    Medication: Depo- Medrol 40mg     Diagnosis: ALLERGIC RHINITIS (ICD-477.9)    Route: IM    Site: LUOQ gluteus    Exp Date: 09/2013     Lot #: [id number]    Mfr: Pharmacia    Comments: Patient received 120mg  Depo-Medrol    Patient tolerated injection without complications    Given by: Zella Ball Ewing CMA Duncan Dull) (February 02, 2011 3:48 PM)  Injection # 2:    Medication: Depo- Medrol 80mg     Diagnosis: ALLERGIC RHINITIS (ICD-477.9)    Route: IM    Site: LUOQ gluteus    Exp Date: 09/2013    Lot #: [id number]    Mfr: Pharmacia    Given by: Zella Ball Ewing CMA Duncan Dull) (February 02, 2011 3:48 PM)  Orders Added: 1)  Depo- Medrol 40mg  [J1030] 2)  Depo- Medrol 80mg  [J1040] 3)  Admin of Therapeutic Inj  intramuscular or subcutaneous [60630]  4)  Est. Patient Level IV [04540]

## 2011-06-24 ENCOUNTER — Ambulatory Visit (INDEPENDENT_AMBULATORY_CARE_PROVIDER_SITE_OTHER): Payer: BC Managed Care – PPO | Admitting: Internal Medicine

## 2011-06-24 ENCOUNTER — Encounter: Payer: Self-pay | Admitting: Internal Medicine

## 2011-06-24 VITALS — BP 100/72 | HR 66 | Temp 97.9°F | Ht 68.0 in | Wt 201.1 lb

## 2011-06-24 DIAGNOSIS — B029 Zoster without complications: Secondary | ICD-10-CM

## 2011-06-24 DIAGNOSIS — Z0000 Encounter for general adult medical examination without abnormal findings: Secondary | ICD-10-CM

## 2011-06-24 HISTORY — DX: Zoster without complications: B02.9

## 2011-06-24 MED ORDER — VALACYCLOVIR HCL 1 G PO TABS: ORAL_TABLET | ORAL | Status: DC

## 2011-06-24 NOTE — Patient Instructions (Addendum)
Take all new medications as prescribed Continue all other medications as before Please return in 3 mo with Lab testing done 3-5 days before

## 2011-06-24 NOTE — Assessment & Plan Note (Signed)
Mild to mod, for valtrex course, d/w pt natural course, to avoid persons without past hx or vaccination

## 2011-06-24 NOTE — Progress Notes (Signed)
  Subjective:    Patient ID: Tanner [name], male    DOB: [birth year], 40 y.o.   MRN: [id number]  HPI here with acute onset pain and dysesthesia to left shoudler and upper arm 3 days ago, then onset rash as well 2 days ago with mult tender bumps to medial left upper arm and lesser to arm below the elbow to wrist as well;  No prior hx of similar, and no recent illness but has been under signficant emotional financial stress lately. Pt denies chest pain, increased sob or doe, wheezing, orthopnea, PND, increased LE swelling, palpitations, dizziness or syncope.  Pt denies new neurological symptoms such as new headache, or facial or extremity weakness or numbness except for the above.   Pt denies polydipsia, polyuria. Past Medical History  Diagnosis Date  . ALLERGIC RHINITIS 08/29/2007  . BACK PAIN 01/08/2009  . Cough 01/27/2008  . Dysuria 01/19/2011  . FATIGUE 01/19/2011  . GASTROENTERITIS, ACUTE 01/08/2009  . GERD 08/29/2007  . HEARING LOSS 05/10/2008  . 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 RLQ PAIN 09/14/2008  . SKIN LESION 10/28/2010  . URI 02/02/2011  . Wheezing 10/22/2009   Past Surgical History  Procedure Date  . Pilonidal cystectomy     reports that he has never smoked. He does not have any smokeless tobacco history on file. He reports that he drinks alcohol. His drug history not on file. family history includes Coronary artery disease in his other and Depression in his other. Allergies not on file No current outpatient prescriptions on file prior to visit.   Review of Systems Review of Systems  Constitutional: Negative for diaphoresis and unexpected weight change.  HENT: Negative for drooling and tinnitus.   Eyes: Negative for photophobia and visual disturbance.  Respiratory: Negative for choking and stridor.   Gastrointestinal: Negative for vomiting and blood in stool.          Objective:   Physical Exam BP 100/72  Pulse 66  Temp(Src) 97.9 F (36.6 C) (Oral)  Ht 5\' 8"  (1.727 m)   Wt 201 lb 2 oz (91.23 kg)  BMI 30.58 kg/m2  SpO2 97% Physical Exam  VS noted Constitutional: Pt appears well-developed and well-nourished.  HENT: Head: Normocephalic.  Right Ear: External ear normal.  Left Ear: External ear normal.  Eyes: Conjunctivae and EOM are normal. Pupils are equal, round, and reactive to light.  Neck: Normal range of motion. Neck supple.  Cardiovascular: Normal rate and regular rhythm.   Pulmonary/Chest: Effort normal and breath sounds normal.  Abd:  Soft, NT, non-distended, + BS Neurological: Pt is alert. No cranial nerve deficit.  Skin: Skin is warm. No erythema. except for left medial upper arm and arm with mult grouped vesicles on erythem base in dermatomal fashion Psychiatric: Pt behavior is normal. Thought content normal.         Assessment & Plan:

## 2011-10-01 ENCOUNTER — Other Ambulatory Visit: Payer: Self-pay | Admitting: Internal Medicine

## 2011-10-01 ENCOUNTER — Other Ambulatory Visit (INDEPENDENT_AMBULATORY_CARE_PROVIDER_SITE_OTHER): Payer: BC Managed Care – PPO

## 2011-10-01 DIAGNOSIS — Z0000 Encounter for general adult medical examination without abnormal findings: Secondary | ICD-10-CM

## 2011-10-01 LAB — CBC WITH DIFFERENTIAL/PLATELET
Eosinophils Relative: 3.6 % (ref 0.0–5.0)
Monocytes Relative: 8.2 % (ref 3.0–12.0)
Neutrophils Relative %: 52.8 % (ref 43.0–77.0)
Platelets: 213 10*3/uL (ref 150.0–400.0)
WBC: 8.1 10*3/uL (ref 4.5–10.5)

## 2011-10-01 LAB — URINALYSIS, ROUTINE W REFLEX MICROSCOPIC
Leukocytes, UA: NEGATIVE
Specific Gravity, Urine: 1.025 (ref 1.000–1.030)
Urobilinogen, UA: 0.2 (ref 0.0–1.0)

## 2011-10-02 LAB — HEPATIC FUNCTION PANEL
ALT: 33 U/L (ref 0–53)
AST: 28 U/L (ref 0–37)
Bilirubin, Direct: 0.1 mg/dL (ref 0.0–0.3)
Total Bilirubin: 0.3 mg/dL (ref 0.3–1.2)

## 2011-10-02 LAB — BASIC METABOLIC PANEL
BUN: 17 mg/dL (ref 6–23)
Calcium: 8.4 mg/dL (ref 8.4–10.5)
GFR: 91.52 mL/min (ref >60.00)
Glucose, Bld: 85 mg/dL (ref 70–99)
Potassium: 4 mEq/L (ref 3.5–5.1)

## 2011-10-02 LAB — LDL CHOLESTEROL, DIRECT: Direct LDL: 89.1 mg/dL

## 2011-10-02 LAB — LIPID PANEL: VLDL: 89.6 mg/dL — ABNORMAL HIGH (ref 0.0–40.0)

## 2011-10-02 LAB — TSH: TSH: 1.23 u[IU]/mL (ref 0.35–5.50)

## 2011-10-06 ENCOUNTER — Ambulatory Visit (INDEPENDENT_AMBULATORY_CARE_PROVIDER_SITE_OTHER): Payer: BC Managed Care – PPO | Admitting: Internal Medicine

## 2011-10-06 ENCOUNTER — Encounter: Payer: Self-pay | Admitting: Internal Medicine

## 2011-10-06 VITALS — BP 108/74 | HR 74 | Temp 97.5°F | Ht 68.0 in | Wt 199.1 lb

## 2011-10-06 DIAGNOSIS — J309 Allergic rhinitis, unspecified: Secondary | ICD-10-CM

## 2011-10-06 DIAGNOSIS — E781 Pure hyperglyceridemia: Secondary | ICD-10-CM | POA: Insufficient documentation

## 2011-10-06 DIAGNOSIS — Z0000 Encounter for general adult medical examination without abnormal findings: Secondary | ICD-10-CM

## 2011-10-06 MED ORDER — FEXOFENADINE HCL 180 MG PO TABS: 180.0000 mg | ORAL_TABLET | Freq: Every day | ORAL | Status: DC

## 2011-10-06 NOTE — Patient Instructions (Addendum)
Please follow lower fat, lower cholesterol diet Take all new medications as prescribed Please return in 3 months for repeat cholesterol check (LAB only);  Please call the phone number [phone] (the PhoneTree System) for results of testing in 2-3 days;  When calling, simply dial the number, and when prompted enter the MRN number above (the Medical Record Number) and the # key, then the message should start. Please return in 1 year for your yearly visit, or sooner if needed, with Lab testing done 3-5 days before

## 2011-10-06 NOTE — Progress Notes (Signed)
Subjective:    Patient ID: Tanner [name], male    DOB: 1971/06/03, 40 y.o.   MRN: [id number]  HPI  Here for wellness and f/u;  Overall doing ok;  Pt denies CP, worsening SOB, DOE, wheezing, orthopnea, PND, worsening LE edema, palpitations, dizziness or syncope.  Pt denies neurological change such as new Headache, facial or extremity weakness.  Pt denies polydipsia, polyuria, or low sugar symptoms. Pt states overall good compliance with treatment and medications, good tolerability, and trying to follow lower cholesterol diet.  Pt denies worsening depressive symptoms, suicidal ideation or panic. No fever, wt loss, night sweats, loss of appetite, or other constitutional symptoms.  Pt states good ability with ADL's, low fall risk, home safety reviewed and adequate, no significant changes in hearing or vision, and occasionally active with exercise, and  Admits to some dietary indiscretion with freq eating out and more fat in diet on further hx.  Does have several wks ongoing nasal allergy symptoms with clear congestion, itch and sneeze, without fever, pain, ST, wheezing, except for recurring cough for 4 wks, happens every yr this time of the yr. Past Medical History  Diagnosis Date  . ALLERGIC RHINITIS 08/29/2007  . BACK PAIN 01/08/2009  . Cough 01/27/2008  . Dysuria 01/19/2011  . FATIGUE 01/19/2011  . GASTROENTERITIS, ACUTE 01/08/2009  . GERD 08/29/2007  . HEARING LOSS 05/10/2008  . 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 RLQ PAIN 09/14/2008  . SKIN LESION 10/28/2010  . URI 02/02/2011  . Wheezing 10/22/2009   Past Surgical History  Procedure Date  . Pilonidal cystectomy     reports that he has never smoked. He does not have any smokeless tobacco history on file. He reports that he drinks alcohol. His drug history not on file. family history includes Coronary artery disease in his other and Depression in his other. No Known Allergies No current outpatient prescriptions on file prior to visit.   Review  of Systems Review of Systems  Constitutional: Negative for diaphoresis, activity change, appetite change and unexpected weight change.  HENT: Negative for hearing loss, ear pain, facial swelling, mouth sores and neck stiffness.   Eyes: Negative for pain, redness and visual disturbance.  Respiratory: Negative for shortness of breath and wheezing.   Cardiovascular: Negative for chest pain and palpitations.  Gastrointestinal: Negative for diarrhea, blood in stool, abdominal distention and rectal pain.  Genitourinary: Negative for hematuria, flank pain and decreased urine volume.  Musculoskeletal: Negative for myalgias and joint swelling.  Skin: Negative for color change and wound.  Neurological: Negative for syncope and numbness.  Hematological: Negative for adenopathy.  Psychiatric/Behavioral: Negative for hallucinations, self-injury, decreased concentration and agitation.      Objective:   Physical Exam BP 108/74  Pulse 74  Temp(Src) 97.5 F (36.4 C) (Oral)  Ht 5\' 8"  (1.727 m)  Wt 199 lb 1.3 oz (90.302 kg)  BMI 30.27 kg/m2  SpO2 96% Physical Exam  VS noted Constitutional: Pt is oriented to person, place, and time. Appears well-developed and well-nourished.  HENT:  Head: Normocephalic and atraumatic.  Right Ear: External ear normal.  Left Ear: External ear normal.  Nose: Nose normal.  Mouth/Throat: Oropharynx is clear and moist.  Bilat tm's mild erythema.  Sinus nontender.  Pharynx mild erythema Eyes: Conjunctivae and EOM are normal. Pupils are equal, round, and reactive to light.  Neck: Normal range of motion. Neck supple. No JVD present. No tracheal deviation present.  Cardiovascular: Normal rate, regular rhythm, normal heart sounds and intact distal  pulses.   Pulmonary/Chest: Effort normal and breath sounds normal.  Abdominal: Soft. Bowel sounds are normal. There is no tenderness.  Musculoskeletal: Normal range of motion. Exhibits no edema.  Lymphadenopathy:  Has no cervical  adenopathy.  Neurological: Pt is alert and oriented to person, place, and time. Pt has normal reflexes. No cranial nerve deficit.  Skin: Skin is warm and dry. No rash noted.  Psychiatric:  Has  normal mood and affect. Behavior is normal.     Assessment & Plan:

## 2011-10-06 NOTE — Assessment & Plan Note (Signed)
Marked elev due to dietary, to improve low fat diet, f/u labs 3 mo

## 2011-10-10 ENCOUNTER — Encounter: Payer: Self-pay | Admitting: Internal Medicine

## 2011-10-10 NOTE — Assessment & Plan Note (Signed)
With post nasal gtt and cough - for antihist asd,  to f/u any worsening symptoms or concerns

## 2011-10-10 NOTE — Assessment & Plan Note (Signed)

## 2011-12-04 ENCOUNTER — Telehealth: Payer: Self-pay | Admitting: Internal Medicine

## 2011-12-04 DIAGNOSIS — L989 Disorder of the skin and subcutaneous tissue, unspecified: Secondary | ICD-10-CM

## 2011-12-04 NOTE — Telephone Encounter (Signed)
The pt called and stated he has a swollen gland under his arm and is hoping to get a referral to a dermatologist to have it evaluated.  Please advise if this can be done. Thanks!

## 2011-12-04 NOTE — Telephone Encounter (Signed)
Done per emr 

## 2012-04-15 ENCOUNTER — Telehealth: Payer: Self-pay

## 2012-04-15 DIAGNOSIS — J309 Allergic rhinitis, unspecified: Secondary | ICD-10-CM

## 2012-04-15 NOTE — Telephone Encounter (Signed)
Done per emr 

## 2012-04-15 NOTE — Telephone Encounter (Signed)
Patient would like a referral to allergy MD. Call back number [phone]

## 2012-04-15 NOTE — Telephone Encounter (Signed)
Called the patient left detailed message referral requested has been done.

## 2012-05-20 ENCOUNTER — Ambulatory Visit (INDEPENDENT_AMBULATORY_CARE_PROVIDER_SITE_OTHER)
Admission: RE | Admit: 2012-05-20 | Discharge: 2012-05-20 | Disposition: A | Discharge status: Home / Self Care | Payer: BC Managed Care – PPO | Source: Ambulatory Visit | Attending: Internal Medicine | Admitting: Internal Medicine

## 2012-05-20 ENCOUNTER — Ambulatory Visit (INDEPENDENT_AMBULATORY_CARE_PROVIDER_SITE_OTHER): Payer: BC Managed Care – PPO | Admitting: Internal Medicine

## 2012-05-20 ENCOUNTER — Encounter: Payer: Self-pay | Admitting: Internal Medicine

## 2012-05-20 VITALS — BP 118/88 | HR 90 | Ht 68.0 in | Wt 208.0 lb

## 2012-05-20 DIAGNOSIS — R05 Cough: Secondary | ICD-10-CM

## 2012-05-20 MED ORDER — BENZONATATE 200 MG PO CAPS: 200.0000 mg | ORAL_CAPSULE | Freq: Three times a day (TID) | ORAL | Status: AC | PRN

## 2012-05-20 NOTE — Progress Notes (Signed)
05/20/12- 73 yoM never smoker referred by Dr. Jonny Ruiz for evaluation of cough. He says "for years" he has had a persistent cough. This is usually a dry cough but after eating cough is relieved by regurgitant belching which brings a food. He tends to cough it end exhalation. No history of asthma or pneumonia. Cough is especially bad and persistent after colds. He tends to cough with eating because he chokes easily. Some awareness of heart burn. Some history of seasonal allergic rhinitis but he does not notice postnasal drip very often. He denies chest pain or shortness of breath except in the middle of a coughing spell. He has tried and failed a number of OTC products including antihistamines and cough syrups. Tessalon helped some in the past. Hydrocodone helped but he doesn't like to use it unless his cough is really bad because it makes him feel strange. He mentions that his father had persistent cough- this He had a history of coronary disease and heart valve replacement. Mother died with emphysema after long smoking history. Married, working as a Air cabin crew. Drinking 1 sixpack per week. Has small children and another on the way. We discussed exposure to colds associated with children in the household.  Prior to Admission medications   Medication Sig Start Date End Date Taking? Authorizing Provider  fexofenadine (ALLEGRA) 180 MG tablet Take 1 tablet (180 mg total) by mouth daily. 10/06/11  Yes Corwin Levins, MD  benzonatate (TESSALON) 200 MG capsule Take 1 capsule (200 mg total) by mouth 3 (three) times daily as needed for cough. 05/20/12 05/27/12  Waymon Budge, MD   Past Medical History  Diagnosis Date  . ALLERGIC RHINITIS 08/29/2007  . BACK PAIN 01/08/2009  . Cough 01/27/2008  . Dysuria 01/19/2011  . FATIGUE 01/19/2011  . GASTROENTERITIS, ACUTE 01/08/2009  . GERD 08/29/2007  . HEARING LOSS 05/10/2008  . 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 RLQ PAIN 09/14/2008  . SKIN LESION 10/28/2010  . URI  02/02/2011  . Wheezing 10/22/2009   Past Surgical History  Procedure Date  . Pilonidal cystectomy    Family History  Problem Relation Age of Onset  . Depression Other   . Coronary artery disease Other     male 1st degree relative <50   History   Social History  . Marital Status: Married    Spouse Name: N/A    Number of Children: 1  . Years of Education: N/A   Occupational History  . [employer] Aud[employer]   Social History Main Topics  . Smoking status: Never Smoker   . Smokeless tobacco: Not on file  . Alcohol Use: Yes  . Drug Use: Not on file  . Sexually Active: Not on file   Other Topics Concern  . Not on file   Social History Narrative  . No narrative on file   ROS-see HPI Constitutional:   No-   weight loss, night sweats, fevers, chills, fatigue, lassitude. HEENT:   No-  headaches, difficulty swallowing, tooth/dental problems, sore throat,       No-  sneezing, itching, ear ache, nasal congestion, post nasal drip,  CV:  No-   chest pain, orthopnea, PND, swelling in lower extremities, anasarca, dizziness, palpitations Resp: No-   shortness of breath with exertion or at rest.              + productive cough,  + non-productive cough,  No- coughing up of blood.  No-   change in color of mucus.  No- wheezing.   Skin: No-   rash or lesions. GI:  + heartburn, indigestion, No-abdominal pain, nausea, vomiting, diarrhea,                 change in bowel habits, loss of appetite GU: No-   dysuria, change in color of urine, no urgency or frequency.  No- flank pain. MS:  No-   joint pain or swelling.  No- decreased range of motion.  No- back pain. Neuro-     nothing unusual Psych:  No- change in mood or affect. No depression or anxiety.  No memory loss.  OBJ- Physical Exam General- Alert, Oriented, Affect-appropriate, Distress- none acute,  Skin- rash-none, lesions- none, excoriation- none Lymphadenopathy- none Head- atraumatic            Eyes-  Gross vision intact, PERRLA, conjunctivae and secretions clear            Ears- Hearing, canals-normal            Nose- Clear, no-Septal dev, mucus, polyps, erosion, perforation             Throat- Mallampati II-III , mucosa clear , drainage- none, tonsils- atrophic Neck- flexible , trachea midline, no stridor , thyroid nl, carotid no bruit Chest - symmetrical excursion , unlabored           Heart/CV- RRR , no murmur , no gallop  , no rub, nl s1 s2                           - JVD- none , edema- none, stasis changes- none, varices- none           Lung- clear to P&A, wheeze- none, + dry cough,  dullness-none, rub- none           Chest wall-  Abd- tender-no, distended-no, bowel sounds-present, HSM- no Br/ Gen/ Rectal- Not done, not indicated Extrem- cyanosis- none, clubbing, none, atrophy- none, strength- nl Neuro- grossly intact to observation

## 2012-05-20 NOTE — Patient Instructions (Addendum)
Recommend otc omeprazole twice every day, before meals. Try this for a month and see what effect it has on your cough.  Script Benzonatate perles for cough as needed  Order- CXR- dx cough  Order- schedule PFT dx cough

## 2012-05-22 DIAGNOSIS — R05 Cough: Secondary | ICD-10-CM | POA: Insufficient documentation

## 2012-05-22 NOTE — Assessment & Plan Note (Signed)
He may have some laryngeal incompetence with slight aspiration but more likely this is severe cyclical cough associated with reflux. Plan-chest x-ray, PFT, omeprazole twice daily and reflux precautions, Tessalon Perles. Consider if specific diagnostic procedures for reflux or aspiration are needed.

## 2012-05-24 ENCOUNTER — Telehealth: Payer: Self-pay | Admitting: Internal Medicine

## 2012-05-24 NOTE — Telephone Encounter (Signed)
Pt made aware of cxr

## 2012-07-05 ENCOUNTER — Encounter: Payer: Self-pay | Admitting: Internal Medicine

## 2012-07-05 ENCOUNTER — Ambulatory Visit (INDEPENDENT_AMBULATORY_CARE_PROVIDER_SITE_OTHER): Payer: BC Managed Care – PPO | Admitting: Internal Medicine

## 2012-07-05 VITALS — BP 112/74 | HR 92 | Ht 68.0 in | Wt 203.0 lb

## 2012-07-05 DIAGNOSIS — R05 Cough: Secondary | ICD-10-CM

## 2012-07-05 DIAGNOSIS — K219 Gastro-esophageal reflux disease without esophagitis: Secondary | ICD-10-CM

## 2012-07-05 LAB — PULMONARY FUNCTION TEST

## 2012-07-05 NOTE — Patient Instructions (Addendum)
Continue the omeprazole. It can be taken before breakfast and supper if needed.  Ok to use the tessalon pearls and allegra as needed. Dr Jonny Ruiz can give refill scripts .   I will be happy to try to help again in the future if needed.

## 2012-07-05 NOTE — Progress Notes (Signed)
PFT done today. 

## 2012-07-05 NOTE — Progress Notes (Signed)
05/20/12- 49 yoM never smoker referred by Dr. Jonny Ruiz for evaluation of cough. He says "for years" he has had a persistent cough. This is usually a dry cough but after eating cough is relieved by regurgitant belching which brings a food. He tends to cough it end exhalation. No history of asthma or pneumonia. Cough is especially bad and persistent after colds. He tends to cough with eating because he chokes easily. Some awareness of heart burn. Some history of seasonal allergic rhinitis but he does not notice postnasal drip very often. He denies chest pain or shortness of breath except in the middle of a coughing spell. He has tried and failed a number of OTC products including antihistamines and cough syrups. Tessalon helped some in the past. Hydrocodone helped but he doesn't like to use it unless his cough is really bad because it makes him feel strange. He mentions that his father had persistent cough-  had a history of coronary disease and heart valve replacement. Mother died with emphysema after long smoking history. Married, working as a Air cabin crew. Drinking 1 sixpack per week. Has small children and another on the way. We discussed exposure to colds associated with children in the household.  07/05/12- 88 yoM never smoker referred by Dr. Jonny Ruiz for evaluation of cough. Has gotten better since being on omeprazole; Tessalon helped stopped the cough at first. Review PFT results with patient. He has been using peppermint as a throat lozenge we discussed how this can increase reflux. He has not been needing antihistamines. PFT: 07/05/2012-within normal limits. Normal small airway flows did show significant response to bronchodilator. A minor reactive airways component can't be excluded. FEV1/FVC 0.84. CXR- 05/20/12 IMPRESSION:  Normal and stable examination.  Original Report Authenticated By: Arnell Sieving, M.D.    ROS-see HPI Constitutional:   No-   weight loss, night sweats, fevers, chills,  fatigue, lassitude. HEENT:   No-  headaches, difficulty swallowing, tooth/dental problems, sore throat,       No-  sneezing, itching, ear ache, nasal congestion, post nasal drip,  CV:  No-   chest pain, orthopnea, PND, swelling in lower extremities, anasarca, dizziness, palpitations Resp: No-   shortness of breath with exertion or at rest.              No- productive cough,  less non-productive cough,  No- coughing up of blood.              No-   change in color of mucus.  No- wheezing.   Skin: No-   rash or lesions. GI:  +/Controlled heartburn, indigestion, No-abdominal pain, nausea, vomiting,  GU:  MS:  No-   joint pain or swelling.   Neuro-     nothing unusual Psych:  No- change in mood or affect. No depression or anxiety.  No memory loss.  OBJ- Physical Exam General- Alert, Oriented, Affect-appropriate, Distress- none acute,  Skin- rash-none, lesions- none, excoriation- none Lymphadenopathy- none Head- atraumatic            Eyes- Gross vision intact, PERRLA, conjunctivae and secretions clear            Ears- Hearing, canals-normal            Nose- Clear, no-Septal dev, mucus, polyps, erosion, perforation             Throat- Mallampati II-III , mucosa clear , drainage- none, tonsils- atrophic Neck- flexible , trachea midline, no stridor , thyroid nl, carotid no bruit Chest -  symmetrical excursion , unlabored           Heart/CV- RRR , no murmur , no gallop  , no rub, nl s1 s2                           - JVD- none , edema- none, stasis changes- none, varices- none           Lung- clear to P&A, wheeze- none,  no- cough,  dullness-none, rub- none           Chest wall-  Abd-  Br/ Gen/ Rectal- Not done, not indicated Extrem- cyanosis- none, clubbing, none, atrophy- none, strength- nl Neuro- grossly intact to observation

## 2012-07-10 NOTE — Assessment & Plan Note (Signed)
I reinforced basics of reflux precautions and advised against peppermint which can aggravate reflux

## 2012-07-10 NOTE — Assessment & Plan Note (Signed)
Normal PFTs and some significant improvement with antireflux therapy makes it likely that this was reflux related cough/cyclical cough

## 2012-10-26 ENCOUNTER — Encounter: Payer: Self-pay | Admitting: Internal Medicine

## 2012-10-26 ENCOUNTER — Ambulatory Visit (INDEPENDENT_AMBULATORY_CARE_PROVIDER_SITE_OTHER): Payer: BC Managed Care – PPO | Admitting: Internal Medicine

## 2012-10-26 VITALS — BP 108/78 | HR 76 | Temp 97.6°F | Ht 68.0 in | Wt 203.0 lb

## 2012-10-26 DIAGNOSIS — J209 Acute bronchitis, unspecified: Secondary | ICD-10-CM

## 2012-10-26 DIAGNOSIS — R05 Cough: Secondary | ICD-10-CM

## 2012-10-26 MED ORDER — ALBUTEROL SULFATE (2.5 MG/3ML) 0.083% IN NEBU: 2.5000 mg | INHALATION_SOLUTION | Freq: Four times a day (QID) | RESPIRATORY_TRACT | Status: DC | PRN

## 2012-10-26 MED ORDER — AZITHROMYCIN 250 MG PO TABS: ORAL_TABLET | ORAL | Status: DC

## 2012-10-26 NOTE — Patient Instructions (Addendum)

## 2012-10-26 NOTE — Progress Notes (Signed)
HPI  Pt presents to the clinic with c/o cough and congestion.  Review of Systems    Past Medical History  Diagnosis Date  . ALLERGIC RHINITIS 08/29/2007  . BACK PAIN 01/08/2009  . Cough 01/27/2008  . Dysuria 01/19/2011  . FATIGUE 01/19/2011  . GASTROENTERITIS, ACUTE 01/08/2009  . GERD 08/29/2007  . HEARING LOSS 05/10/2008  . 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 RLQ PAIN 09/14/2008  . SKIN LESION 10/28/2010  . URI 02/02/2011  . Wheezing 10/22/2009    Family History  Problem Relation Age of Onset  . Depression Other   . Coronary artery disease Other     male 1st degree relative <50    History   Social History  . Marital Status: Married    Spouse Name: N/A    Number of Children: 1  . Years of Education: N/A   Occupational History  . [employer]   Social History Main Topics  . Smoking status: Never Smoker   . Smokeless tobacco: Not on file  . Alcohol Use: Yes  . Drug Use: Not on file  . Sexually Active: Not on file   Other Topics Concern  . Not on file   Social History Narrative  . No narrative on file    No Known Allergies   Constitutional: Positive headache, fatigue. Denies fever or abrupt weight changes.  HEENT:  Positive  nasal congestion and sore throat. Denies eye redness, ear pain, ringing in the ears, wax buildup, runny nose or bloody nose. Respiratory: Positive cough and thick green sputum production. Denies difficulty breathing or shortness of breath.  Cardiovascular: Denies chest pain, chest tightness, palpitations or swelling in the hands or feet.   No other specific complaints in a complete review of systems (except as listed in HPI above).  Objective:    General: Appears his stated age, well developed, well nourished in NAD. HEENT: Head: normal shape and size; Eyes: sclera white, no icterus, conjunctiva pink, PERRLA and EOMs intact; Ears: Tm's gray and intact, normal light reflex; Nose: mucosa pink and moist, septum  midline; Throat/Mouth: + PND. Teeth present, mucosa pink and moist, no exudate noted, no lesions or ulcerations noted.  Neck: Mild cervical lymphadenopathy. Neck supple, trachea midline. No massses, lumps or thyromegaly present.  Cardiovascular: Normal rate and rhythm. S1,S2 noted.  No murmur, rubs or gallops noted. No JVD or BLE edema. No carotid bruits noted. Pulmonary/Chest: Normal effort and positive vesicular breath sounds. No respiratory distress. No wheezes, rales or ronchi noted.      Assessment & Plan:   Acute brochitis  Albuterol inhaler Azithromax x 5 days  RTC as needed or if symptoms persist.

## 2012-10-27 ENCOUNTER — Other Ambulatory Visit: Payer: Self-pay | Admitting: *Deleted

## 2012-10-27 MED ORDER — ALBUTEROL SULFATE HFA 108 (90 BASE) MCG/ACT IN AERS: 2.0000 | INHALATION_SPRAY | Freq: Four times a day (QID) | RESPIRATORY_TRACT | Status: DC | PRN

## 2012-10-27 NOTE — Telephone Encounter (Signed)
[facility] calling for clarification on Proventil rx sent yesterday-does he need nebulizer solution or inhaler. Per Rene Kocher, pt needs nebulizer, rx sent to pharmacy.

## 2012-11-24 ENCOUNTER — Telehealth: Payer: Self-pay

## 2012-11-24 DIAGNOSIS — Z0000 Encounter for general adult medical examination without abnormal findings: Secondary | ICD-10-CM

## 2012-11-24 NOTE — Telephone Encounter (Signed)
Put order in for cpx labs.

## 2012-11-28 ENCOUNTER — Ambulatory Visit (INDEPENDENT_AMBULATORY_CARE_PROVIDER_SITE_OTHER): Payer: BC Managed Care – PPO | Admitting: Internal Medicine

## 2012-11-28 ENCOUNTER — Encounter: Payer: Self-pay | Admitting: Internal Medicine

## 2012-11-28 VITALS — BP 110/70 | HR 108 | Temp 97.1°F | Ht 68.0 in | Wt 195.8 lb

## 2012-11-28 DIAGNOSIS — R059 Cough, unspecified: Secondary | ICD-10-CM

## 2012-11-28 DIAGNOSIS — Z0000 Encounter for general adult medical examination without abnormal findings: Secondary | ICD-10-CM

## 2012-11-28 DIAGNOSIS — E785 Hyperlipidemia, unspecified: Secondary | ICD-10-CM

## 2012-11-28 DIAGNOSIS — J209 Acute bronchitis, unspecified: Secondary | ICD-10-CM

## 2012-11-28 DIAGNOSIS — E781 Pure hyperglyceridemia: Secondary | ICD-10-CM

## 2012-11-28 DIAGNOSIS — R05 Cough: Secondary | ICD-10-CM

## 2012-11-28 MED ORDER — HYDROCODONE-HOMATROPINE 5-1.5 MG/5ML PO SYRP: 5.0000 mL | ORAL_SOLUTION | Freq: Four times a day (QID) | ORAL | Status: DC | PRN

## 2012-11-28 MED ORDER — AZITHROMYCIN 250 MG PO TABS: ORAL_TABLET | ORAL | Status: DC

## 2012-11-28 NOTE — Progress Notes (Signed)
Subjective:    Patient ID: Tanner [name], male    DOB: Aug 25, 1971, 42 y.o.   MRN: [id number]  HPI  Here for wellness and f/u;  Overall doing ok;  Pt denies CP, worsening SOB, DOE, wheezing, orthopnea, PND, worsening LE edema, palpitations, dizziness or syncope.  Pt denies neurological change such as new headache, facial or extremity weakness.  Pt denies polydipsia, polyuria, or low sugar symptoms. Pt states overall good compliance with treatment and medications, good tolerability, and has been trying to follow lower cholesterol diet.  Pt denies worsening depressive symptoms, suicidal ideation or panic. No fever, night sweats, wt loss, loss of appetite, or other constitutional symptoms.  Pt states good ability with ADL's, has low fall risk, home safety reviewed and adequate, no other significant changes in hearing or vision, and only occasionally active with exercise, and does admit to some idetary noncompliance, has somee higher fatty foods and more candy since holidays.  Inciently today with bronchitic symptoms as well. - Here with acute onset mild to mod 2-3 days ST, HA, general weakness and malaise, with prod cough greenish sputum, but Pt denies chest pain, increased sob or doe, wheezing, orthopnea, PND, increased LE swelling, palpitations, dizziness or syncope. Past Medical History  Diagnosis Date  . ALLERGIC RHINITIS 08/29/2007  . BACK PAIN 01/08/2009  . Cough 01/27/2008  . Dysuria 01/19/2011  . FATIGUE 01/19/2011  . GASTROENTERITIS, ACUTE 01/08/2009  . GERD 08/29/2007  . HEARING LOSS 05/10/2008  . 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 RLQ PAIN 09/14/2008  . SKIN LESION 10/28/2010  . URI 02/02/2011  . Wheezing 10/22/2009   Past Surgical History  Procedure Date  . Pilonidal cystectomy     reports that he has never smoked. He does not have any smokeless tobacco history on file. He reports that he drinks alcohol. His drug history not on file. family history includes Coronary artery disease in his  other and Depression in his other. No Known Allergies Current Outpatient Prescriptions on File Prior to Visit  Medication Sig Dispense Refill  . albuterol (PROVENTIL HFA;VENTOLIN HFA) 108 (90 BASE) MCG/ACT inhaler Inhale 2 puffs into the lungs every 6 (six) hours as needed for wheezing.  1 Inhaler  0  . fexofenadine (ALLEGRA) 180 MG tablet Take 1 tablet (180 mg total) by mouth daily.  90 tablet  3  . omeprazole (PRILOSEC OTC) 20 MG tablet Take 20 mg by mouth daily.      Marland Kitchen azithromycin (ZITHROMAX) 250 MG tablet Take 2 today, then 1 daily for the next 4 days  6 each  0  . benzonatate (TESSALON) 100 MG capsule Take 100 mg by mouth 3 (three) times daily as needed.       Review of Systems Constitutional: Negative for diaphoresis, activity change, appetite change or unexpected weight change.  HENT: Negative for hearing loss, ear pain, facial swelling, mouth sores and neck stiffness.   Eyes: Negative for pain, redness and visual disturbance.  Respiratory: Negative for shortness of breath and wheezing.   Cardiovascular: Negative for chest pain and palpitations.  Gastrointestinal: Negative for diarrhea, blood in stool, abdominal distention or other pain Genitourinary: Negative for hematuria, flank pain or change in urine volume.  Musculoskeletal: Negative for myalgias and joint swelling.  Skin: Negative for color change and wound.  Neurological: Negative for syncope and numbness. other than noted Hematological: Negative for adenopathy.  Psychiatric/Behavioral: Negative for hallucinations, self-injury, decreased concentration and agitation.      Objective:   Physical Exam BP 110/70  Pulse 108  Temp 97.1 F (36.2 C) (Oral)  Ht 5\' 8"  (1.727 m)  Wt 195 lb 12 oz (88.792 kg)  BMI 29.76 kg/m2  SpO2 96% VS noted, mild ill Constitutional: Pt is oriented to person, place, and time. Appears well-developed and well-nourished.  Head: Normocephalic and atraumatic.  Right Ear: External ear normal.    Left Ear: External ear normal.  Nose: Nose normal.  Mouth/Throat: Oropharynx is clear and moist. except Bilat tm's with mild erythema.  Max sinus areas mild tender.  Pharynx with mild erythema, no exudate Eyes: Conjunctivae and EOM are normal. Pupils are equal, round, and reactive to light.  Neck: Normal range of motion. Neck supple. No JVD present. No tracheal deviation present.  Cardiovascular: Normal rate, regular rhythm, normal heart sounds and intact distal pulses.   Pulmonary/Chest: Effort normal and breath sounds normal.  Abdominal: Soft. Bowel sounds are normal. There is no tenderness. No HSM  Musculoskeletal: Normal range of motion. Exhibits no edema.  Lymphadenopathy:  Has no cervical adenopathy.  Neurological: Pt is alert and oriented to person, place, and time. Pt has normal reflexes. No cranial nerve deficit.  Skin: Skin is warm and dry. No rash noted.  Psychiatric:  Has  normal mood and affect. Behavior is normal.     Assessment & Plan:

## 2012-11-28 NOTE — Assessment & Plan Note (Signed)

## 2012-11-28 NOTE — Assessment & Plan Note (Signed)
Mild to mod, for antibx course,  to f/u any worsening symptoms or concerns 

## 2012-11-28 NOTE — Assessment & Plan Note (Addendum)
To follow lower fat diet, cont low chol, ECG reviewed as per emr

## 2012-11-28 NOTE — Patient Instructions (Addendum)
Please take all new medication as prescribed - the antibiotic, and cough medicine Please continue all other medications as before, and refills have been done if requested. Please have the pharmacy call with any other refills you may need. Please continue your efforts at being more active, low cholesterol/lower fat diet, and weight control. You are otherwise up to date with prevention measures today. Please go to the LAB in the Basement (turn left off the elevator) for the tests to be done today You will be contacted by phone if any changes need to be made immediately.  Otherwise, you will receive a letter about your results with an explanation, but please check with MyChart first. Thank you for enrolling in MyChart. Please follow the instructions below to securely access your online medical record. MyChart allows you to send messages to your doctor, view your test results, renew your prescriptions, schedule appointments, and more. To Log into My Chart online, please go by [employer] or [employer] to [email or web address], or download the MyChart App from the [employer] .  Your Username is:  [email or web address]  Please return in 1 year for your yearly visit, or sooner if needed, with Lab testing done 3-5 days before

## 2013-05-29 ENCOUNTER — Ambulatory Visit (INDEPENDENT_AMBULATORY_CARE_PROVIDER_SITE_OTHER): Payer: BC Managed Care – PPO | Admitting: Internal Medicine

## 2013-05-29 ENCOUNTER — Encounter: Payer: Self-pay | Admitting: Internal Medicine

## 2013-05-29 VITALS — BP 108/70 | HR 73 | Temp 98.1°F | Ht 68.0 in | Wt 202.1 lb

## 2013-05-29 DIAGNOSIS — L723 Sebaceous cyst: Secondary | ICD-10-CM

## 2013-05-29 DIAGNOSIS — L729 Follicular cyst of the skin and subcutaneous tissue, unspecified: Secondary | ICD-10-CM

## 2013-05-29 MED ORDER — AZITHROMYCIN 250 MG PO TABS: ORAL_TABLET | ORAL | Status: DC

## 2013-05-29 NOTE — Progress Notes (Signed)
  Subjective:    Patient ID: Tanner [name], male    DOB: 08/28/1971, 42 y.o.   MRN: [id number]  HPI  Here to f/u with c/o cyst vs abscess to the mid anterior scrotum, with enlarging over past 2 days, and ? Mild tender but also somewhat irritated by boxers.   Past Medical History  Diagnosis Date  . ALLERGIC RHINITIS 08/29/2007  . BACK PAIN 01/08/2009  . Cough 01/27/2008  . Dysuria 01/19/2011  . FATIGUE 01/19/2011  . GASTROENTERITIS, ACUTE 01/08/2009  . GERD 08/29/2007  . HEARING LOSS 05/10/2008  . 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 RLQ PAIN 09/14/2008  . SKIN LESION 10/28/2010  . URI 02/02/2011  . Wheezing 10/22/2009   Past Surgical History  Procedure Laterality Date  . Pilonidal cystectomy      reports that he has never smoked. He does not have any smokeless tobacco history on file. He reports that  drinks alcohol. His drug history is not on file. family history includes Coronary artery disease in his other and Depression in his other. No Known Allergies Current Outpatient Prescriptions on File Prior to Visit  Medication Sig Dispense Refill  . albuterol (PROVENTIL HFA;VENTOLIN HFA) 108 (90 BASE) MCG/ACT inhaler Inhale 2 puffs into the lungs every 6 (six) hours as needed for wheezing.  1 Inhaler  0  . fexofenadine (ALLEGRA) 180 MG tablet Take 1 tablet (180 mg total) by mouth daily.  90 tablet  3  . omeprazole (PRILOSEC OTC) 20 MG tablet Take 20 mg by mouth daily.       No current facility-administered medications on file prior to visit.   Review of Systems All otherwise neg per pt     Objective:   Physical Exam BP 108/70  Pulse 73  Temp(Src) 98.1 F (36.7 C) (Oral)  Ht 5\' 8"  (1.727 m)  Wt 202 lb 2 oz (91.683 kg)  BMI 30.74 kg/m2  SpO2 96% VS noted,  Constitutional: Pt appears well-developed and well-nourished.  HENT: Head: NCAT.  Right Ear: External ear normal.  Left Ear: External ear normal.  Eyes: Conjunctivae and EOM are normal. Pupils are equal, round, and reactive to  light.  Neck: Normal range of motion. Neck supple.  Cardiovascular: Normal rate and regular rhythm.   Pulmonary/Chest: Effort normal and breath sounds normal.  Abd:  Soft, NT, non-distended, + BS GYU: normal male penis, scrotal contents no mass or tender Neurological: Pt is alert. Not confused  Skin: Skin is warm. No erythema. except mid anterior scrotum with 1/2 cm area prob cystic mass with mild erythema, no overt fluctuance or drainage Psychiatric: Pt behavior is normal. Thought content normal.     Assessment & Plan:

## 2013-05-29 NOTE — Patient Instructions (Signed)
Please take all new medication as prescribed Please continue all other medications as before, and refills have been done if requested. If there is no worsening of the "mass" you can watch for now, but if enlarging, please call for referral to urology

## 2013-05-29 NOTE — Assessment & Plan Note (Signed)
With ? Infection, for zpack asd, but o/w ok to watch for now for any worsening

## 2013-09-21 ENCOUNTER — Other Ambulatory Visit: Payer: Self-pay

## 2013-11-21 ENCOUNTER — Ambulatory Visit: Payer: BC Managed Care – PPO | Admitting: Family Medicine

## 2013-11-21 DIAGNOSIS — Z0289 Encounter for other administrative examinations: Secondary | ICD-10-CM

## 2013-12-15 ENCOUNTER — Ambulatory Visit (INDEPENDENT_AMBULATORY_CARE_PROVIDER_SITE_OTHER): Payer: BC Managed Care – PPO | Admitting: Internal Medicine

## 2013-12-15 ENCOUNTER — Encounter: Payer: Self-pay | Admitting: Internal Medicine

## 2013-12-15 VITALS — BP 108/86 | HR 76 | Temp 97.2°F | Wt 197.2 lb

## 2013-12-15 DIAGNOSIS — J209 Acute bronchitis, unspecified: Secondary | ICD-10-CM

## 2013-12-15 MED ORDER — LEVOFLOXACIN 250 MG PO TABS: 250.0000 mg | ORAL_TABLET | Freq: Every day | ORAL | Status: DC

## 2013-12-15 MED ORDER — HYDROCODONE-HOMATROPINE 5-1.5 MG/5ML PO SYRP: 5.0000 mL | ORAL_SOLUTION | Freq: Four times a day (QID) | ORAL | Status: DC | PRN

## 2013-12-15 NOTE — Patient Instructions (Signed)
Please take all new medication as prescribed Please continue all other medications as before, and refills have been done if requested. Please have the pharmacy call with any other refills you may need.  Please remember to sign up for My Chart if you have not done so, as this will be important to you in the future with finding out test results, communicating by private email, and scheduling acute appointments online when needed.

## 2013-12-15 NOTE — Assessment & Plan Note (Signed)
Bronchitis vs atypical pna, given wife hx and his exam - Mild to mod, for antibx course,  to f/u any worsening symptoms or concerns

## 2013-12-15 NOTE — Progress Notes (Signed)
Pre-visit discussion using our clinic review tool. No additional management support is needed unless otherwise documented below in the visit note.  

## 2013-12-15 NOTE — Progress Notes (Signed)
   Subjective:    Patient ID: Tanner [name], male    DOB: 08-Sep-1971, 43 y.o.   MRN: [id number]  HPI  Here with acute onset mild to mod 2-3 days ST, HA, general weakness and malaise, with non prod cough sputum, but Pt denies chest pain, increased sob or doe, wheezing, orthopnea, PND, increased LE swelling, palpitations, dizziness or syncope. Past Medical History  Diagnosis Date  . ALLERGIC RHINITIS 08/29/2007  . BACK PAIN 01/08/2009  . Cough 01/27/2008  . Dysuria 01/19/2011  . FATIGUE 01/19/2011  . GASTROENTERITIS, ACUTE 01/08/2009  . GERD 08/29/2007  . HEARING LOSS 05/10/2008  . 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 RLQ PAIN 09/14/2008  . SKIN LESION 10/28/2010  . URI 02/02/2011  . Wheezing 10/22/2009   Past Surgical History  Procedure Laterality Date  . Pilonidal cystectomy      reports that he has never smoked. He does not have any smokeless tobacco history on file. He reports that he drinks alcohol. His drug history is not on file. family history includes Coronary artery disease in his other; Depression in his other. No Known Allergies No current outpatient prescriptions on file prior to visit.   No current facility-administered medications on file prior to visit.   Wife tx last wk with nonprod cough and pna, daughter had n/v.  Review of Systems All otherwise neg per pt     Objective:   Physical Exam BP 108/86  Pulse 76  Temp(Src) 97.2 F (36.2 C) (Oral)  Wt 197 lb 4 oz (89.472 kg)  SpO2 96% VS noted, mild ill Constitutional: Pt appears well-developed and well-nourished.  HENT: Head: NCAT.  Right Ear: External ear normal.  Left Ear: External ear normal.  Bilat tm's with mild erythema.  Max sinus areas non tender.  Pharynx with mild erythema, no exudate Eyes: Conjunctivae with bilat mild erythema and EOM are normal. Pupils are equal, round, and reactive to light.  Neck: Normal range of motion. Neck supple.  Cardiovascular: Normal rate and regular rhythm.     Pulmonary/Chest: Effort normal and breath sounds without rales or wheezing.  Neurological: Pt is alert. Not confused  Skin: Skin is warm. No erythema.  Psychiatric: Pt behavior is normal. Thought content normal.     Assessment & Plan:

## 2014-01-09 ENCOUNTER — Encounter: Payer: Self-pay | Admitting: Internal Medicine

## 2014-01-09 ENCOUNTER — Ambulatory Visit (INDEPENDENT_AMBULATORY_CARE_PROVIDER_SITE_OTHER)
Admission: RE | Admit: 2014-01-09 | Discharge: 2014-01-09 | Disposition: A | Discharge status: Home / Self Care | Payer: BC Managed Care – PPO | Source: Ambulatory Visit | Attending: Internal Medicine | Admitting: Internal Medicine

## 2014-01-09 ENCOUNTER — Ambulatory Visit (INDEPENDENT_AMBULATORY_CARE_PROVIDER_SITE_OTHER): Payer: BC Managed Care – PPO | Admitting: Internal Medicine

## 2014-01-09 VITALS — BP 120/84 | HR 94 | Temp 98.0°F | Wt 193.2 lb

## 2014-01-09 DIAGNOSIS — R059 Cough, unspecified: Secondary | ICD-10-CM

## 2014-01-09 DIAGNOSIS — R062 Wheezing: Secondary | ICD-10-CM

## 2014-01-09 DIAGNOSIS — R05 Cough: Secondary | ICD-10-CM

## 2014-01-09 MED ORDER — OSELTAMIVIR PHOSPHATE 75 MG PO CAPS: 75.0000 mg | ORAL_CAPSULE | Freq: Two times a day (BID) | ORAL | Status: DC

## 2014-01-09 MED ORDER — HYDROCODONE-HOMATROPINE 5-1.5 MG/5ML PO SYRP: 5.0000 mL | ORAL_SOLUTION | Freq: Four times a day (QID) | ORAL | Status: DC | PRN

## 2014-01-09 MED ORDER — METHYLPREDNISOLONE ACETATE 80 MG/ML IJ SUSP
80.0000 mg | Freq: Once | INTRAMUSCULAR | Status: AC
  Administered 2014-01-09: 80 mg via INTRAMUSCULAR

## 2014-01-09 MED ORDER — ALBUTEROL SULFATE HFA 108 (90 BASE) MCG/ACT IN AERS: 2.0000 | INHALATION_SPRAY | Freq: Four times a day (QID) | RESPIRATORY_TRACT | Status: DC | PRN

## 2014-01-09 MED ORDER — AZITHROMYCIN 250 MG PO TABS: ORAL_TABLET | ORAL | Status: DC

## 2014-01-09 MED ORDER — PREDNISONE 10 MG PO TABS
ORAL_TABLET | ORAL | Status: DC
Start: 2014-01-09 — End: 2014-07-31

## 2014-01-09 NOTE — Progress Notes (Signed)
   Subjective:    Patient ID: Tanner [name], male    DOB: [birth year], 43 y.o.   MRN: [id number]  HPI   Here with acute onset 2-3 days flu like symptoms with bilat eye matting, HA, diffuse myalgias, feverish, head congestion, prod cough worse with lying down at night, as well as onset sob/wheezing last PM. Pt denies chest pain, orthopnea, PND, increased LE swelling, palpitations, dizziness or syncope. Pt denies new neurological symptoms such as new headache, or facial or extremity weakness or numbness   Pt denies polydipsia, polyuria, Past Medical History  Diagnosis Date  . ALLERGIC RHINITIS 08/29/2007  . BACK PAIN 01/08/2009  . Cough 01/27/2008  . Dysuria 01/19/2011  . FATIGUE 01/19/2011  . GASTROENTERITIS, ACUTE 01/08/2009  . GERD 08/29/2007  . HEARING LOSS 05/10/2008  . 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 RLQ PAIN 09/14/2008  . SKIN LESION 10/28/2010  . URI 02/02/2011  . Wheezing 10/22/2009   Past Surgical History  Procedure Laterality Date  . Pilonidal cystectomy      reports that he has never smoked. He does not have any smokeless tobacco history on file. He reports that he drinks alcohol. His drug history is not on file. family history includes Coronary artery disease in his other; Depression in his other. No Known Allergies No current outpatient prescriptions on file prior to visit.   No current facility-administered medications on file prior to visit.   Review of Systems  Constitutional: Negative for unexpected weight change, or unusual diaphoresis  HENT: Negative for tinnitus.   Eyes: Negative for photophobia and visual disturbance.  Respiratory: Negative for choking and stridor.   Gastrointestinal: Negative for vomiting and blood in stool.  Genitourinary: Negative for hematuria and decreased urine volume.  Musculoskeletal: Negative for acute joint swelling Skin: Negative for color change and wound.  Neurological: Negative for tremors and numbness other than noted    Psychiatric/Behavioral: Negative for decreased concentration or  hyperactivity.       Objective:   Physical Exam BP 120/84  Pulse 94  Temp(Src) 98 F (36.7 C) (Oral)  Wt 193 lb 4 oz (87.658 kg)  SpO2 92% VS noted, mild to mod ill Constitutional: Pt appears well-developed and well-nourished.  HENT: Head: NCAT.  Right Ear: External ear normal.  Left Ear: External ear normal.  Eyes: bilat Conjunctivae with erythema, mucousy d/c, and EOM are normal. Pupils are equal, round, and reactive to light.  Neck: Normal range of motion. Neck supple. with bilat submandib LA, tender Cardiovascular: Normal rate and regular rhythm.   Pulmonary/Chest: Effort normal and breath sounds decresaed bilat, with wheeze right mid lung field noted  Neurological: Pt is alert. Not confused  Skin: Skin is warm. No erythema.  Psychiatric: Pt behavior is normal. Thought content normal.     Assessment & Plan:

## 2014-01-09 NOTE — Progress Notes (Signed)
Pre visit review using our clinic review tool, if applicable. No additional management support is needed unless otherwise documented below in the visit note. 

## 2014-01-09 NOTE — Assessment & Plan Note (Signed)
Mild, likely related to infectious etiology/bronchospasm, for cxr - r/o pna, also fro depomedrol IM, predpac asd, advair 250/50 bid x 2 wks (sample given), and proair prn use

## 2014-01-09 NOTE — Assessment & Plan Note (Signed)
C/w prob viral illness, though cant r/o pna as well; for cxr, also tamiflu/zpack, cough med prn

## 2014-01-09 NOTE — Patient Instructions (Signed)
You had the steroid shot today for the wheezing and shortness of breath Please take all new medication as prescribed - the two antibiotics, prednisone (short course), and cough medicine as needed You are also given the sample of Advair 250/50 to use at 1 puff twice per day until gone (should last about 2 wks) You are also given a prescription for a "rescue" albuterol inhaler (the usual inhaler for asthma) to be used if need extra help with the breathing Please continue all other medications as before, and refills have been done if requested. Please have the pharmacy call with any other refills you may need.  Please go to the XRAY Department in the Basement (go straight as you get off the elevator) for the x-ray testing You will be contacted by phone if any changes need to be made immediately.  Otherwise, you will receive a letter about your results with an explanation, but please check with MyChart first.

## 2014-07-31 ENCOUNTER — Encounter: Payer: Self-pay | Admitting: Internal Medicine

## 2014-07-31 ENCOUNTER — Ambulatory Visit (INDEPENDENT_AMBULATORY_CARE_PROVIDER_SITE_OTHER): Payer: BC Managed Care – PPO | Admitting: Internal Medicine

## 2014-07-31 VITALS — BP 120/80 | HR 84 | Temp 98.2°F | Ht 68.0 in | Wt 201.8 lb

## 2014-07-31 DIAGNOSIS — N529 Male erectile dysfunction, unspecified: Secondary | ICD-10-CM

## 2014-07-31 DIAGNOSIS — N528 Other male erectile dysfunction: Secondary | ICD-10-CM

## 2014-07-31 MED ORDER — TADALAFIL 20 MG PO TABS: 20.0000 mg | ORAL_TABLET | Freq: Every day | ORAL | Status: DC | PRN

## 2014-07-31 NOTE — Progress Notes (Signed)
Pre visit review using our clinic review tool, if applicable. No additional management support is needed unless otherwise documented below in the visit note. 

## 2014-07-31 NOTE — Assessment & Plan Note (Signed)
Likely situational stress related, Ok for cialis prn, check testosterone if he desires,  to f/u any worsening symptoms or concerns

## 2014-07-31 NOTE — Patient Instructions (Addendum)
Please take all new medication as prescribed  Please remember the voucher online   Please continue all other medications as before, and refills have been done if requested.  Please have the pharmacy call with any other refills you may need.  Please keep your appointments with your specialists as you may have planned  Please go to the LAB in the Basement (turn left off the elevator) for the tests to be done today  You will be contacted by phone if any changes need to be made immediately.  Otherwise, you will receive a letter about your results with an explanation, but please check with MyChart first.

## 2014-07-31 NOTE — Progress Notes (Signed)
   Subjective:    Patient ID: KLYE BESECKER, male    DOB: 02-23-1971, 43 y.o.   MRN: [id number]  HPI  Here to f/u, married with 3 children, wants to have more child, no prior issues but now with some ED issue, and remains confusing to her and him. Has somewhat less desire overall , but still has desire, but with last attempt could not attain erection, which led to increased anxiety.  Wife should be fertile next wk, asks for ED med. Past Medical History  Diagnosis Date  . ALLERGIC RHINITIS 08/29/2007  . BACK PAIN 01/08/2009  . Cough 01/27/2008  . Dysuria 01/19/2011  . FATIGUE 01/19/2011  . GASTROENTERITIS, ACUTE 01/08/2009  . GERD 08/29/2007  . HEARING LOSS 05/10/2008  . 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 RLQ PAIN 09/14/2008  . SKIN LESION 10/28/2010  . URI 02/02/2011  . Wheezing 10/22/2009   Past Surgical History  Procedure Laterality Date  . Pilonidal cystectomy      reports that he has never smoked. He does not have any smokeless tobacco history on file. He reports that he drinks alcohol. His drug history is not on file. family history includes Coronary artery disease in his other; Depression in his other. No Known Allergies No current outpatient prescriptions on file prior to visit.   No current facility-administered medications on file prior to visit.   Review of Systems All otherwise neg per pt     Objective:   Physical Exam BP 120/80  Pulse 84  Temp(Src) 98.2 F (36.8 C) (Oral)  Ht 5' 8"  (1.727 m)  Wt 201 lb 12.8 oz (91.536 kg)  BMI 30.69 kg/m2  SpO2 95% VS noted,  Constitutional: Pt appears well-developed, well-nourished.  HENT: Head: NCAT.  Right Ear: External ear normal.  Left Ear: External ear normal.  Eyes: . Pupils are equal, round, and reactive to light. Conjunctivae and EOM are normal Neck: Normal range of motion. Neck supple.  Cardiovascular: Normal rate and regular rhythm.   Pulmonary/Chest: Effort normal and breath sounds normal.  Neurological: Pt is  alert. Not confused , motor grossly intact Skin: Skin is warm. No rash Psychiatric: Pt behavior is normal. No agitation.     Assessment & Plan:

## 2015-01-16 ENCOUNTER — Ambulatory Visit (INDEPENDENT_AMBULATORY_CARE_PROVIDER_SITE_OTHER): Payer: 59 | Admitting: Internal Medicine

## 2015-01-16 ENCOUNTER — Encounter: Payer: Self-pay | Admitting: Internal Medicine

## 2015-01-16 VITALS — BP 118/80 | HR 61 | Temp 97.6°F | Resp 18 | Ht 68.0 in | Wt 200.0 lb

## 2015-01-16 DIAGNOSIS — R05 Cough: Secondary | ICD-10-CM

## 2015-01-16 DIAGNOSIS — R062 Wheezing: Secondary | ICD-10-CM

## 2015-01-16 DIAGNOSIS — R059 Cough, unspecified: Secondary | ICD-10-CM

## 2015-01-16 DIAGNOSIS — J309 Allergic rhinitis, unspecified: Secondary | ICD-10-CM

## 2015-01-16 MED ORDER — METHYLPREDNISOLONE ACETATE 80 MG/ML IJ SUSP
80.0000 mg | Freq: Once | INTRAMUSCULAR | Status: AC
  Administered 2015-01-16: 80 mg via INTRAMUSCULAR

## 2015-01-16 MED ORDER — LEVOFLOXACIN 500 MG PO TABS
500.0000 mg | ORAL_TABLET | Freq: Every day | ORAL | Status: DC
Start: 2015-01-16 — End: 2015-05-17

## 2015-01-16 MED ORDER — HYDROCODONE-HOMATROPINE 5-1.5 MG/5ML PO SYRP: 5.0000 mL | ORAL_SOLUTION | Freq: Four times a day (QID) | ORAL | Status: DC | PRN

## 2015-01-16 MED ORDER — PREDNISONE 10 MG PO TABS: ORAL_TABLET | ORAL | Status: DC

## 2015-01-16 NOTE — Progress Notes (Signed)
Pre visit review using our clinic review tool, if applicable. No additional management support is needed unless otherwise documented below in the visit note. 

## 2015-01-16 NOTE — Patient Instructions (Signed)
You had the steroid shot today  Please take all new medication as prescribed - the antibiotic, cough medicine, and prednisone  Please continue all other medications as before, and refills have been done if requested.  Please have the pharmacy call with any other refills you may need.  Please keep your appointments with your specialists as you may have planned

## 2015-01-16 NOTE — Assessment & Plan Note (Signed)
Mild to mod, for depomedrol IM, predpac asd, to f/u any worsening symptoms or concerns 

## 2015-01-16 NOTE — Assessment & Plan Note (Signed)
With recent flare, also to improve with steroid tx, cont OTC zyrtec prn,  to f/u any worsening symptoms or concerns

## 2015-01-16 NOTE — Progress Notes (Signed)
   Subjective:    Patient ID: Tanner [name], male    DOB: February 21, 1971, 44 y.o.   MRN: [id number]  HPI  Here with acute onset mild to mod 2-3 days ST, HA, general weakness and malaise, with prod cough greenish sputum, but Pt denies chest pain, increased sob or doe, wheezing, orthopnea, PND, increased LE swelling, palpitations, dizziness or syncope, except for onset mild wheezing, sob last pm. Does have several wks ongoing nasal allergy symptoms with clearish congestion, itch and sneezing, without fever, pain, ST, cough, swelling or wheezing. Past Medical History  Diagnosis Date  . ALLERGIC RHINITIS 08/29/2007  . BACK PAIN 01/08/2009  . Cough 01/27/2008  . Dysuria 01/19/2011  . FATIGUE 01/19/2011  . GASTROENTERITIS, ACUTE 01/08/2009  . GERD 08/29/2007  . HEARING LOSS 05/10/2008  . 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 RLQ PAIN 09/14/2008  . SKIN LESION 10/28/2010  . URI 02/02/2011  . Wheezing 10/22/2009   Past Surgical History  Procedure Laterality Date  . Pilonidal cystectomy      reports that he has never smoked. He does not have any smokeless tobacco history on file. He reports that he drinks alcohol. His drug history is not on file. family history includes Coronary artery disease in his other; Depression in his other. No Known Allergies Current Outpatient Prescriptions on File Prior to Visit  Medication Sig Dispense Refill  . tadalafil (CIALIS) 20 MG tablet Take 1 tablet (20 mg total) by mouth daily as needed for erectile dysfunction. 3 tablet 0   No current facility-administered medications on file prior to visit.   Review of Systems  Constitutional: Negative for unusual diaphoresis or other sweats  HENT: Negative for ringing in ear Eyes: Negative for double vision or worsening visual disturbance.  Respiratory: Negative for choking and stridor.   Gastrointestinal: Negative for vomiting or other signifcant bowel change Genitourinary: Negative for hematuria or decreased urine volume.    Musculoskeletal: Negative for other MSK pain or swelling Skin: Negative for color change and worsening wound.  Neurological: Negative for tremors and numbness other than noted  Psychiatric/Behavioral: Negative for decreased concentration or agitation other than above        Objective:   Physical Exam BP 118/80 mmHg  Pulse 61  Temp(Src) 97.6 F (36.4 C) (Oral)  Resp 18  Ht 5' 8"  (1.727 m)  Wt 200 lb 0.6 oz (90.738 kg)  BMI 30.42 kg/m2  SpO2 97% VS noted, mild ill Constitutional: Pt appears well-developed, well-nourished.  HENT: Head: NCAT.  Right Ear: External ear normal.  Left Ear: External ear normal.  Eyes: . Pupils are equal, round, and reactive to light. Conjunctivae and EOM are normal Bilat tm's with mild erythema.  Max sinus areas mild tender.  Pharynx with mild erythema, no exudate Neck: Normal range of motion. Neck supple.  Cardiovascular: Normal rate and regular rhythm.   Pulmonary/Chest: Effort normal and breath sounds with mild bilat wheezing, no rales.  Neurological: Pt is alert. Not confused , motor grossly intact Skin: Skin is warm. No rash Psychiatric: Pt behavior is normal. No agitation.     Assessment & Plan:

## 2015-01-16 NOTE — Assessment & Plan Note (Signed)
C/w infectious, cant r/o pna - for levaquin asd,  to f/u any worsening symptoms or concerns, declines cxr

## 2015-05-16 ENCOUNTER — Encounter: Payer: Self-pay | Admitting: Internal Medicine

## 2015-05-17 ENCOUNTER — Emergency Department (HOSPITAL_COMMUNITY)
Admission: EM | Admit: 2015-05-17 | Discharge: 2015-05-17 | Disposition: A | Discharge status: Home / Self Care | Payer: 59 | Attending: Emergency Medicine | Admitting: Emergency Medicine

## 2015-05-17 ENCOUNTER — Encounter (HOSPITAL_COMMUNITY): Payer: Self-pay | Admitting: Family Medicine

## 2015-05-17 DIAGNOSIS — Z8719 Personal history of other diseases of the digestive system: Secondary | ICD-10-CM | POA: Diagnosis not present

## 2015-05-17 DIAGNOSIS — R197 Diarrhea, unspecified: Secondary | ICD-10-CM | POA: Insufficient documentation

## 2015-05-17 DIAGNOSIS — R1084 Generalized abdominal pain: Secondary | ICD-10-CM | POA: Insufficient documentation

## 2015-05-17 DIAGNOSIS — H9190 Unspecified hearing loss, unspecified ear: Secondary | ICD-10-CM | POA: Diagnosis not present

## 2015-05-17 DIAGNOSIS — Z872 Personal history of diseases of the skin and subcutaneous tissue: Secondary | ICD-10-CM | POA: Diagnosis not present

## 2015-05-17 DIAGNOSIS — Z8619 Personal history of other infectious and parasitic diseases: Secondary | ICD-10-CM | POA: Diagnosis not present

## 2015-05-17 DIAGNOSIS — R112 Nausea with vomiting, unspecified: Secondary | ICD-10-CM | POA: Insufficient documentation

## 2015-05-17 DIAGNOSIS — Z8709 Personal history of other diseases of the respiratory system: Secondary | ICD-10-CM | POA: Diagnosis not present

## 2015-05-17 DIAGNOSIS — Z792 Long term (current) use of antibiotics: Secondary | ICD-10-CM | POA: Diagnosis not present

## 2015-05-17 LAB — CBC WITH DIFFERENTIAL/PLATELET
Basophils Absolute: 0 10*3/uL (ref 0.0–0.1)
Basophils Relative: 0 % (ref 0–1)
Eosinophils Absolute: 0 10*3/uL (ref 0.0–0.7)
Eosinophils Relative: 0 % (ref 0–5)
HCT: 45.9 % (ref 39.0–52.0)
HEMOGLOBIN: 15.8 g/dL (ref 13.0–17.0)
LYMPHS ABS: 0.7 10*3/uL (ref 0.7–4.0)
Lymphocytes Relative: 7 % — ABNORMAL LOW (ref 12–46)
MCH: 29.9 pg (ref 26.0–34.0)
MCHC: 34.4 g/dL (ref 30.0–36.0)
MCV: 86.9 fL (ref 78.0–100.0)
MONOS PCT: 8 % (ref 3–12)
Monocytes Absolute: 0.7 10*3/uL (ref 0.1–1.0)
Neutro Abs: 7.6 10*3/uL (ref 1.7–7.7)
Neutrophils Relative %: 85 % — ABNORMAL HIGH (ref 43–77)
Platelets: 157 10*3/uL (ref 150–400)
RBC: 5.28 MIL/uL (ref 4.22–5.81)
RDW: 12.8 % (ref 11.5–15.5)
WBC: 9.1 10*3/uL (ref 4.0–10.5)

## 2015-05-17 LAB — COMPREHENSIVE METABOLIC PANEL
ALBUMIN: 3.5 g/dL (ref 3.5–5.0)
ALK PHOS: 78 U/L (ref 38–126)
ALT: 22 U/L (ref 17–63)
ANION GAP: 8 (ref 5–15)
AST: 26 U/L (ref 15–41)
BUN: 11 mg/dL (ref 6–20)
CO2: 23 mmol/L (ref 22–32)
Calcium: 8.3 mg/dL — ABNORMAL LOW (ref 8.9–10.3)
Chloride: 105 mmol/L (ref 101–111)
Creatinine, Ser: 1.37 mg/dL — ABNORMAL HIGH (ref 0.61–1.24)
GFR calc Af Amer: >60 mL/min (ref >60)
GFR calc non Af Amer: >60 mL/min (ref >60)
Glucose, Bld: 116 mg/dL — ABNORMAL HIGH (ref 65–99)
POTASSIUM: 3.7 mmol/L (ref 3.5–5.1)
Sodium: 136 mmol/L (ref 135–145)
Total Bilirubin: 0.7 mg/dL (ref 0.3–1.2)
Total Protein: 6.8 g/dL (ref 6.5–8.1)

## 2015-05-17 LAB — URINALYSIS, ROUTINE W REFLEX MICROSCOPIC
GLUCOSE, UA: NEGATIVE mg/dL
Ketones, ur: 40 mg/dL — AB
Leukocytes, UA: NEGATIVE
Nitrite: NEGATIVE
PROTEIN: 100 mg/dL — AB
Specific Gravity, Urine: 1.031 — ABNORMAL HIGH (ref 1.005–1.030)
Urobilinogen, UA: 0.2 mg/dL (ref 0.0–1.0)
pH: 5.5 (ref 5.0–8.0)

## 2015-05-17 LAB — URINE MICROSCOPIC-ADD ON

## 2015-05-17 LAB — LIPASE, BLOOD: LIPASE: 22 U/L (ref 22–51)

## 2015-05-17 MED ORDER — SODIUM CHLORIDE 0.9 % IV BOLUS (SEPSIS)
1000.0000 mL | Freq: Once | INTRAVENOUS | Status: AC
  Administered 2015-05-17: 1000 mL via INTRAVENOUS

## 2015-05-17 MED ORDER — DIPHENOXYLATE-ATROPINE 2.5-0.025 MG PO TABS
2.0000 | ORAL_TABLET | Freq: Once | ORAL | Status: AC
  Administered 2015-05-17: 2 via ORAL
  Filled 2015-05-17: qty 2

## 2015-05-17 MED ORDER — DICYCLOMINE HCL 20 MG PO TABS: 20.0000 mg | ORAL_TABLET | Freq: Two times a day (BID) | ORAL | Status: DC

## 2015-05-17 MED ORDER — DIPHENOXYLATE-ATROPINE 2.5-0.025 MG PO TABS: 2.0000 | ORAL_TABLET | Freq: Four times a day (QID) | ORAL | Status: DC | PRN

## 2015-05-17 MED ORDER — KETOROLAC TROMETHAMINE 30 MG/ML IJ SOLN
30.0000 mg | Freq: Once | INTRAMUSCULAR | Status: AC
Start: 2015-05-17 — End: 2015-05-17
  Administered 2015-05-17: 30 mg via INTRAVENOUS
  Filled 2015-05-17: qty 1

## 2015-05-17 NOTE — ED Notes (Signed)
Pt denies any continued urge to defecate, sts he was able to go urinate without incident.

## 2015-05-17 NOTE — ED Notes (Signed)
Pt here for N,V,D and having diarrhea every hour. sts started Wednesday after work. Denies being around anyone that is sick.

## 2015-05-17 NOTE — ED Provider Notes (Signed)
CSN: [id number]     Arrival date & time 05/17/15  1345 History   First MD Initiated Contact with Patient 05/17/15 1501     Chief Complaint  Patient presents with  . Nausea  . Emesis     (Consider location/radiation/quality/duration/timing/severity/associated sxs/prior Treatment) HPI Comments: Patient with no significant past medical history, presents with complaints of 48 hours of nausea, vomiting, and diarrhea. Patient has had associated generalized abdominal cramping. He states that he is having watery, nonbloody stools approximately every 45 minutes. No recent vomiting but has been nauseous and has no appetite. Patient denies recent travel or camping. Patient was seen at an urgent care yesterday and prescribed Augmentin and Zofran but denies any prior antibiotic use. 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 He has had low-grade fever into the low 100's. No urinary sx. No other tx's PTA. The onset of this condition was acute. The course is constant. Aggravating factors: none. Alleviating factors: none.     The history is provided by the patient.    Past Medical History  Diagnosis Date  . ALLERGIC RHINITIS 08/29/2007  . BACK PAIN 01/08/2009  . Cough 01/27/2008  . Dysuria 01/19/2011  . FATIGUE 01/19/2011  . GASTROENTERITIS, ACUTE 01/08/2009  . GERD 08/29/2007  . HEARING LOSS 05/10/2008  . 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 RLQ PAIN 09/14/2008  . SKIN LESION 10/28/2010  . URI 02/02/2011  . Wheezing 10/22/2009   Past Surgical History  Procedure Laterality Date  . Pilonidal cystectomy     Family History  Problem Relation Age of Onset  . Depression Other   . Coronary artery disease Other     male 1st degree relative <50   History  Substance Use Topics  . Smoking status: Never Smoker   . Smokeless tobacco: Not on file  . Alcohol Use: Yes    Review of Systems  Constitutional: Negative for fever.  HENT: Negative for  rhinorrhea and sore throat.   Eyes: Negative for redness.  Respiratory: Negative for cough and shortness of breath.   Cardiovascular: Negative for chest pain.  Gastrointestinal: Positive for nausea, vomiting, abdominal pain and diarrhea. Negative for constipation and blood in stool.  Genitourinary: Negative for dysuria.  Musculoskeletal: Negative for myalgias.  Skin: Negative for rash.  Neurological: Negative for headaches.    Allergies  Review of patient's allergies indicates no known allergies.  Home Medications   Prior to Admission medications   Medication Sig Start Date End Date Taking? Authorizing Provider  amoxicillin-clavulanate (AUGMENTIN) 875-125 MG per tablet Take 1 tablet by mouth every 12 (twelve) hours. 05/16/15  Yes Historical Provider, MD  ondansetron (ZOFRAN-ODT) 8 MG disintegrating tablet Take 8 mg by mouth every 8 (eight) hours. 05/16/15  Yes Historical Provider, MD  HYDROcodone-homatropine (HYCODAN) 5-1.5 MG/5ML syrup Take 5 mLs by mouth every 6 (six) hours as needed for cough. 01/16/15   Biagio Borg, MD  levofloxacin (LEVAQUIN) 500 MG tablet Take 1 tablet (500 mg total) by mouth daily. 01/16/15   Biagio Borg, MD  predniSONE (DELTASONE) 10 MG tablet 3 tabs by mouth per day for 3 days,2tabs per day for 3 days,1tab per day for 3 days 01/16/15   Biagio Borg, MD  tadalafil (CIALIS) 20 MG tablet Take 1 tablet (20 mg total) by mouth daily as needed for erectile dysfunction. 07/31/14   Biagio Borg, MD   BP 132/90 mmHg  Pulse 108  Temp(Src) 99.5 F (37.5  C)  Resp 18  SpO2 96%   Physical Exam  Constitutional: He appears well-developed and well-nourished.  HENT:  Head: Normocephalic and atraumatic.  Mouth/Throat: Oropharynx is clear and moist.  Eyes: Conjunctivae are normal. Right eye exhibits no discharge. Left eye exhibits no discharge.  Neck: Normal range of motion. Neck supple.  Cardiovascular: Normal rate, regular rhythm and normal heart sounds.   No murmur heard. HR  upper 90's  Pulmonary/Chest: Effort normal and breath sounds normal.  Abdominal: Soft. Bowel sounds are normal. He exhibits no distension. There is tenderness. There is no rebound and no guarding.  Mild generalized tenderness, no focal tenderness or rebound.   Musculoskeletal: He exhibits no edema or tenderness.  Neurological: He is alert.  Skin: Skin is warm and dry.  Psychiatric: He has a normal mood and affect.  Nursing note and vitals reviewed.   ED Course  Procedures (including critical care time) Labs Review Labs Reviewed  CBC WITH DIFFERENTIAL/PLATELET - Abnormal; Notable for the following:    Neutrophils Relative % 85 (*)    Lymphocytes Relative 7 (*)    All other components within normal limits  COMPREHENSIVE METABOLIC PANEL - Abnormal; Notable for the following:    Glucose, Bld 116 (*)    Creatinine, Ser 1.37 (*)    Calcium 8.3 (*)    All other components within normal limits  URINALYSIS, ROUTINE W REFLEX MICROSCOPIC (NOT AT [facility]) - Abnormal; Notable for the following:    Color, Urine AMBER (*)    APPearance CLOUDY (*)    Specific Gravity, Urine 1.031 (*)    Hgb urine dipstick TRACE (*)    Bilirubin Urine SMALL (*)    Ketones, ur 40 (*)    Protein, ur 100 (*)    All other components within normal limits  URINE MICROSCOPIC-ADD ON - Abnormal; Notable for the following:    Casts HYALINE CASTS (*)    All other components within normal limits  LIPASE, BLOOD    Imaging Review No results found.   EKG Interpretation None       3:38 PM Patient seen and examined. Work-up initiated. Medications ordered.   Vital signs reviewed and are as follows: BP 132/90 mmHg  Pulse 108  Temp(Src) 99.5 F (37.5 C)  Resp 18  SpO2 96%  6:56 PM Patient reports improvement with fluids.  He reports that he is beginning to feel hungry. No further diarrhea in emergency department. Patient states that he is agreeable to discharge to home with PCP follow-up as needed.  The patient  was urged to return to the Emergency Department immediately with worsening of current symptoms, worsening abdominal pain, persistent vomiting, blood noted in stools, fever, or any other concerns. The patient verbalized understanding.   Counseled on brat diet. Discharge to home with Bentyl and Lomotil.    MDM   Final diagnoses:  Nausea vomiting and diarrhea   Patient with nausea, vomiting, low-grade fever and diarrhea. Diarrhea is nonbloody. Labs are reassuring. Patient has had some generalized abdominal cramping but no focal abdominal tenderness. No significant risk factors for C. Difficile colitis. Patient improved clinically in emergency department with conservative treatment. No indications for imaging at this point. Patient appears well, nontoxic. Will treat conservatively and have him follow-up with his PCP if not improving or return if worsening. Patient states he wants to discontinue the Augmentin at this time and I agree with this. I encouraged him to start taking this if he developed a high fever or notes blood  in his stool.  Vitals are stable, no fever. Suspect mild dehydration. Lungs are clear. No focal abdominal pain, no concern for appendicitis, cholecystitis, pancreatitis, ruptured viscus, UTI, kidney stone, or any other emergent abdominal etiology. Supportive therapy indicated with return if symptoms worsen. Patient counseled.     Carlisle Cater, PA-C 05/17/15 1900  Alfonzo Beers, MD 05/17/15 1901

## 2015-05-17 NOTE — ED Notes (Signed)
Pt sts he was at work on Wednesday and began to "feel like I was coming down with something" and has been couch-ridden since.  Pt sts he is running to the bathroom approx every 45 minutes.  Sts drinking water makes him nauseous, but denies any vomiting.  Sts he was given an antibiotic and Zofran at [facility] yesterday, but is not feeling any better and would "like for the diarrhea and headache to stop.

## 2015-05-17 NOTE — Discharge Instructions (Signed)
Please read and follow all provided instructions.  Your diagnoses today include:  1. Nausea vomiting and diarrhea     Tests performed today include:  Blood counts and electrolytes  Blood tests to check liver and kidney function  Blood tests to check pancreas function  Urine test to look for infection and pregnancy (in women)  Vital signs. See below for your results today.   Medications prescribed:   Lomotil - medication for diarrhea  Take any prescribed medications only as directed.   Bentyl - medication for intestinal cramping  Home care instructions:   Follow any educational materials contained in this packet.   Your abdominal pain, nausea, vomiting, and diarrhea may be caused by a viral gastroenteritis also called 'stomach flu'. You should rest for the next several days. Keep drinking plenty of fluids and use the medicine for nausea as directed.    Drink clear liquids for the next 24 hours and introduce solid foods slowly after 24 hours using the b.r.a.t. diet (Bananas, Rice, Applesauce, Toast, Yogurt).    Follow-up instructions: Please follow-up with your primary care provider in the next 3 days for further evaluation of your symptoms. If you are not feeling better in 48 hours you may have a condition that is more serious and you need re-evaluation.   Return instructions:  SEEK IMMEDIATE MEDICAL ATTENTION IF:  If you have pain that does not go away or becomes severe   A temperature above 101F develops   Repeated vomiting occurs (multiple episodes)   If you have pain that becomes localized to portions of the abdomen. The right side could possibly be appendicitis. In an adult, the left lower portion of the abdomen could be colitis or diverticulitis.   Blood is being passed in stools or vomit (bright red or black tarry stools)   You develop chest pain, difficulty breathing, dizziness or fainting, or become confused, poorly responsive, or inconsolable (young  children)  If you have any other emergent concerns regarding your health  Additional Information: Abdominal (belly) pain can be caused by many things. Your caregiver performed an examination and possibly ordered blood/urine tests and imaging (CT scan, x-rays, ultrasound). Many cases can be observed and treated at home after initial evaluation in the emergency department. Even though you are being discharged home, abdominal pain can be unpredictable. Therefore, you need a repeated exam if your pain does not resolve, returns, or worsens. Most patients with abdominal pain don't have to be admitted to the hospital or have surgery, but serious problems like appendicitis and gallbladder attacks can start out as nonspecific pain. Many abdominal conditions cannot be diagnosed in one visit, so follow-up evaluations are very important.  Your vital signs today were: BP 116/72 mmHg   Pulse 98   Temp(Src) 99.5 F (37.5 C)   Resp 20   SpO2 99% If your blood pressure (bp) was elevated above 135/85 this visit, please have this repeated by your doctor within one month. --------------

## 2015-05-22 ENCOUNTER — Ambulatory Visit (INDEPENDENT_AMBULATORY_CARE_PROVIDER_SITE_OTHER): Payer: 59 | Admitting: Internal Medicine

## 2015-05-22 ENCOUNTER — Encounter: Payer: Self-pay | Admitting: Internal Medicine

## 2015-05-22 VITALS — BP 102/86 | HR 78 | Temp 98.2°F | Ht 68.0 in | Wt 200.0 lb

## 2015-05-22 DIAGNOSIS — R197 Diarrhea, unspecified: Secondary | ICD-10-CM

## 2015-05-22 NOTE — Patient Instructions (Signed)
Please continue all other medications as before, and refills have been done if requested.  Please have the pharmacy call with any other refills you may need.  Please continue your efforts at being more active, low cholesterol diet, and weight control.  Please keep your appointments with your specialists as you may have planned

## 2015-05-22 NOTE — Progress Notes (Signed)
Pre visit review using our clinic review tool, if applicable. No additional management support is needed unless otherwise documented below in the visit note. 

## 2015-05-22 NOTE — Progress Notes (Signed)
   Subjective:    Patient ID: Tanner [name], male    DOB: [birth year], 43 y.o.   MRN: [id number]  HPI  Here to f/u recent episode profuse diarrhea on augmentin, seen at ED, augmentin stopped, no further diarrhea.  Denies worsening reflux, abd pain, dysphagia, n/v, bowel change or blood.  No fever or other new symptoms.  Pt denies chest pain, increased sob or doe, wheezing, orthopnea, PND, increased LE swelling, palpitations, dizziness or syncope. Past Medical History  Diagnosis Date  . ALLERGIC RHINITIS 08/29/2007  . BACK PAIN 01/08/2009  . Cough 01/27/2008  . Dysuria 01/19/2011  . FATIGUE 01/19/2011  . GASTROENTERITIS, ACUTE 01/08/2009  . GERD 08/29/2007  . HEARING LOSS 05/10/2008  . 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 RLQ PAIN 09/14/2008  . SKIN LESION 10/28/2010  . URI 02/02/2011  . Wheezing 10/22/2009   Past Surgical History  Procedure Laterality Date  . Pilonidal cystectomy      reports that he has never smoked. He does not have any smokeless tobacco history on file. He reports that he drinks alcohol. His drug history is not on file. family history includes Coronary artery disease in his other; Depression in his other. No Known Allergies Current Outpatient Prescriptions on File Prior to Visit  Medication Sig Dispense Refill  . dicyclomine (BENTYL) 20 MG tablet Take 1 tablet (20 mg total) by mouth 2 (two) times daily. 20 tablet 0  . diphenoxylate-atropine (LOMOTIL) 2.5-0.025 MG per tablet Take 2 tablets by mouth 4 (four) times daily as needed for diarrhea or loose stools. 20 tablet 0  . ibuprofen (ADVIL,MOTRIN) 200 MG tablet Take 200 mg by mouth every 6 (six) hours as needed for fever or moderate pain.    . Multiple Vitamin (MULTIVITAMIN) tablet Take 1 tablet by mouth daily.    . ondansetron (ZOFRAN-ODT) 8 MG disintegrating tablet Take 8 mg by mouth every 8 (eight) hours.    . tadalafil (CIALIS) 20 MG tablet Take 1 tablet (20 mg total) by mouth daily as needed for erectile dysfunction.  (Patient not taking: Reported on 05/22/2015) 3 tablet 0   No current facility-administered medications on file prior to visit.   Review of Systems All otherwise neg per pt     Objective:   Physical Exam BP 102/86 mmHg  Pulse 78  Temp(Src) 98.2 F (36.8 C) (Oral)  Ht 5' 8"  (1.727 m)  Wt 200 lb (90.719 kg)  BMI 30.42 kg/m2  SpO2 97% VS noted,  Constitutional: Pt appears in no significant distress HENT: Head: NCAT.  Right Ear: External ear normal.  Left Ear: External ear normal.  Eyes: . Pupils are equal, round, and reactive to light. Conjunctivae and EOM are normal Neck: Normal range of motion. Neck supple.  Cardiovascular: Normal rate and regular rhythm.   Pulmonary/Chest: Effort normal and breath sounds without rales or wheezing.  Abd:  Soft, NT, ND, + BS Neurological: Pt is alert. Not confused , motor grossly intact Skin: Skin is warm. No rash, no LE edema Psychiatric: Pt behavior is normal. No agitation.      Assessment & Plan:

## 2015-05-23 DIAGNOSIS — R197 Diarrhea, unspecified: Secondary | ICD-10-CM | POA: Insufficient documentation

## 2015-05-23 NOTE — Assessment & Plan Note (Signed)
?   antibx related vs viral (suspect augmentin related), symptoms resolved, afeb and exam benign, no recent blood or other unsuual assoc symptoms, no new complaints, to cont to follow, no further tx needed at this time

## 2016-01-07 ENCOUNTER — Telehealth: Payer: Self-pay | Admitting: Internal Medicine

## 2016-01-07 NOTE — Telephone Encounter (Signed)
Left message to call back  

## 2016-01-07 NOTE — Telephone Encounter (Signed)
Ok with me 

## 2016-01-07 NOTE — Telephone Encounter (Signed)
Since Dr. Martinique is coming next month, we have more availability. I am willing to accept patient

## 2016-01-07 NOTE — Telephone Encounter (Signed)
Pt has been scheduled.  °

## 2016-01-07 NOTE — Telephone Encounter (Addendum)
Pt would like to know if you would accept him as a transfer pt from Dr Jenny Reichmann at [facility] pt you may not accept a transfer, but I would ask.  Pt states this office is closer.  Dr Jenny Reichmann is this ok with you for pt to transfer?

## 2016-02-11 ENCOUNTER — Encounter: Payer: Self-pay | Admitting: Family Medicine

## 2016-02-11 ENCOUNTER — Ambulatory Visit (INDEPENDENT_AMBULATORY_CARE_PROVIDER_SITE_OTHER): Payer: 59 | Admitting: Family Medicine

## 2016-02-11 VITALS — BP 120/80 | HR 66 | Temp 97.8°F | Ht 68.0 in | Wt 208.0 lb

## 2016-02-11 DIAGNOSIS — E781 Pure hyperglyceridemia: Secondary | ICD-10-CM | POA: Diagnosis not present

## 2016-02-11 DIAGNOSIS — N528 Other male erectile dysfunction: Secondary | ICD-10-CM | POA: Diagnosis not present

## 2016-02-11 DIAGNOSIS — K219 Gastro-esophageal reflux disease without esophagitis: Secondary | ICD-10-CM | POA: Diagnosis not present

## 2016-02-11 NOTE — Progress Notes (Signed)
Pre visit review using our clinic review tool, if applicable. No additional management support is needed unless otherwise documented below in the visit note. 

## 2016-02-11 NOTE — Assessment & Plan Note (Signed)
S:Prn cialis effective A/P: we also discussed increasing aerobic activity which could help with blood flow and maybe reduce ED symptoms. Currently no regular exercise.

## 2016-02-11 NOTE — Assessment & Plan Note (Signed)
S:No current rx- cut back on fried foods/improved diet A/P: continue without rx- prn tums reasonable

## 2016-02-11 NOTE — Progress Notes (Signed)
Tanner Reddish, MD Phone: [phone]  Subjective:  Patient presents today to establish care with me as their new primary care provider. Patient was formerly a patient of Dr. Jenny Reichmann. Chief complaint-noted.   We had a prolonged discussion about prostate cancer screening and colon cancer screening. He had been requesting these at present but we discussed appropriate intervals. Discussed testicular cancer self exams. Extended counseling.   See problem oriented charting ROS- minor left knee pain medial when lifts his leg but usually improves when contacts ground. No chest pain or shortness of breath. No headache or blurry vision.  Some mild fatigue but does have 4 kids and fulltime job.   The following were reviewed and entered/updated in epic: Past Medical History  Diagnosis Date  . ALLERGIC RHINITIS 08/29/2007  . BACK PAIN 01/08/2009  . Cough 01/27/2008  . Dysuria 01/19/2011  . GASTROENTERITIS, ACUTE 01/08/2009  . GERD 08/29/2007  . HEARING LOSS 05/10/2008  . 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 SKIN LESION 10/28/2010  . Wheezing 10/22/2009  . Shingles outbreak 06/24/2011   Patient Active Problem List   Diagnosis Date Noted  . Hypertriglyceridemia 10/06/2011    Priority: Medium  . Erectile dysfunction 07/31/2014    Priority: Low  . Allergic rhinitis 08/29/2007    Priority: Low  . GERD 08/29/2007    Priority: Low  . CYST, PILONIDAL W/ABSCESS 08/29/2007    Priority: Low   Past Surgical History  Procedure Laterality Date  . Pilonidal cystectomy      Family History  Problem Relation Age of Onset  . Depression Other   . Coronary artery disease Father     60 at diagnosis. 53 at death- MI complications. father was smoker  . Valvular heart disease Father     rheumatic fever- replaced  . Lung cancer Mother     40    Medications- reviewed and updated Current Outpatient Prescriptions  Medication Sig Dispense Refill  . ibuprofen (ADVIL,MOTRIN) 200 MG tablet Take 200 mg by mouth  every 6 (six) hours as needed for fever or moderate pain. Reported on 02/11/2016    . Multiple Vitamin (MULTIVITAMIN) tablet Take 1 tablet by mouth daily. Reported on 02/11/2016    . tadalafil (CIALIS) 20 MG tablet Take 1 tablet (20 mg total) by mouth daily as needed for erectile dysfunction. (Patient not taking: Reported on 05/22/2015) 3 tablet 0  Also started b12 supplement  Allergies-reviewed and updated No Known Allergies  Social History   Social History  . Marital Status: Married    Spouse Name: N/A  . Number of Children: 1  . Years of Education: N/A   Occupational History  . [employer]   Social History Main Topics  . Smoking status: Never Smoker   . Smokeless tobacco: None  . Alcohol Use: 3.6 oz/week    6 Standard drinks or equivalent per week  . Drug Use: No  . Sexual Activity: Not Asked   Other Topics Concern  . 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 Oropharynx normal Neck: no thyromegaly CV: RRR no murmurs rubs or gallops Lungs: CTAB no crackles, wheeze, rhonchi Abdomen: soft/nontender/nondistended/normal bowel sounds.  Ext: no edema Skin: warm, dry Neuro: grossly normal, moves all extremities, PERRLA  Left Knee: Normal to inspection with no erythema or effusion or obvious bony abnormalities. Palpation normal with no warmth or joint line tenderness or patellar tenderness or condyle tenderness. ROM normal in flexion and extension and lower leg rotation. Ligaments with solid consistent  endpoints including ACL, PCL, LCL, MCL. Negative Mcmurray's and provocative meniscal tests. Non painful patellar compression. Patellar and quadriceps tendons unremarkable. Hamstring and quadriceps strength is normal.   Assessment/Plan:  Hypertriglyceridemia S: poorly controlled on no rx- reports stuffed his plate with lots of pizza night before last set of labs. States has had labs through work with no issue but none of those are available today Lab Results  Component Value Date   CHOL 154 10/01/2011   HDL 31.10* 10/01/2011   LDLCALC 104* 08/12/2009   LDLDIRECT 89.1 10/01/2011   TRIG * 10/01/2011    448.0 Triglyceride is over 400; calculations on Lipids are invalid.   CHOLHDL 5 10/01/2011   A/P: opted to check these before CPE in 3-6 months. Would definitely strongly consider statin at that level.     GERD S:No current rx- cut back on fried foods/improved diet A/P: continue without rx- prn tums reasonable   Erectile dysfunction S:Prn cialis effective A/P: we also discussed increasing aerobic activity which could help with blood flow and maybe reduce ED symptoms. Currently no regular exercise.     3-6 month CPE. Important message to patient "My only concern is your lack of exercise. I would strive at minimum for 3x a week for 30 minutes." would also benefit from some weight loss. Return precautions advised. Also check in on left knee at follow up.   Meds ordered this encounter  Medications  . vitamin B-12 (CYANOCOBALAMIN) 1000 MCG tablet    Sig: Take 1,000 mcg by mouth daily.   The duration of face-to-face time during this visit was 30 minutes. Greater than 50% of this time was spent in counseling, explanation of diagnosis, planning of further management, and/or coordination of care.

## 2016-02-11 NOTE — Patient Instructions (Signed)
Overall things look great  My only concern is your lack of exercise. I would strive at minimum for 3x a week for 30 minutes.   Do not need prostate cancer screening until age 44 at earliest. Some people opt not to do this at all.   Colon cancer screening starts at age 41 unless you find out your brothers have had precancerous polyps  Follow up for physical in 3-6 months

## 2016-02-11 NOTE — Assessment & Plan Note (Signed)
S: poorly controlled on no rx- reports stuffed his plate with lots of pizza night before last set of labs. States has had labs through work with no issue but none of those are available today Lab Results  Component Value Date   CHOL 154 10/01/2011   HDL 31.10* 10/01/2011   LDLCALC 104* 08/12/2009   LDLDIRECT 89.1 10/01/2011   TRIG * 10/01/2011    448.0 Triglyceride is over 400; calculations on Lipids are invalid.   CHOLHDL 5 10/01/2011   A/P: opted to check these before CPE in 3-6 months. Would definitely strongly consider statin at that level.

## 2016-04-16 ENCOUNTER — Other Ambulatory Visit (INDEPENDENT_AMBULATORY_CARE_PROVIDER_SITE_OTHER): Payer: 59

## 2016-04-16 DIAGNOSIS — Z0000 Encounter for general adult medical examination without abnormal findings: Secondary | ICD-10-CM | POA: Diagnosis not present

## 2016-04-16 LAB — BASIC METABOLIC PANEL
BUN: 18 mg/dL (ref 6–23)
CHLORIDE: 106 meq/L (ref 96–112)
CO2: 27 meq/L (ref 19–32)
Calcium: 8.8 mg/dL (ref 8.4–10.5)
Creatinine, Ser: 0.98 mg/dL (ref 0.40–1.50)
GFR: 88.45 mL/min (ref >60.00)
GLUCOSE: 85 mg/dL (ref 70–99)
Potassium: 4.2 mEq/L (ref 3.5–5.1)
SODIUM: 138 meq/L (ref 135–145)

## 2016-04-16 LAB — POC URINALSYSI DIPSTICK (AUTOMATED)
Bilirubin, UA: NEGATIVE
Blood, UA: NEGATIVE
Glucose, UA: NEGATIVE
Ketones, UA: NEGATIVE
LEUKOCYTES UA: NEGATIVE
NITRITE UA: NEGATIVE
PROTEIN UA: NEGATIVE
Spec Grav, UA: 1.02
Urobilinogen, UA: 0.2
pH, UA: 7

## 2016-04-16 LAB — CBC WITH DIFFERENTIAL/PLATELET
Basophils Absolute: 0 10*3/uL (ref 0.0–0.1)
Basophils Relative: 0.7 % (ref 0.0–3.0)
Eosinophils Absolute: 0.3 10*3/uL (ref 0.0–0.7)
Eosinophils Relative: 4.5 % (ref 0.0–5.0)
HCT: 44.5 % (ref 39.0–52.0)
Hemoglobin: 15 g/dL (ref 13.0–17.0)
Lymphocytes Relative: 32.1 % (ref 12.0–46.0)
Lymphs Abs: 2.2 10*3/uL (ref 0.7–4.0)
MCHC: 33.8 g/dL (ref 30.0–36.0)
MCV: 88.2 fl (ref 78.0–100.0)
MONOS PCT: 8.3 % (ref 3.0–12.0)
Monocytes Absolute: 0.6 10*3/uL (ref 0.1–1.0)
NEUTROS ABS: 3.7 10*3/uL (ref 1.4–7.7)
Neutrophils Relative %: 54.4 % (ref 43.0–77.0)
Platelets: 195 10*3/uL (ref 150.0–400.0)
RBC: 5.04 Mil/uL (ref 4.22–5.81)
RDW: 13.5 % (ref 11.5–15.5)
WBC: 6.7 10*3/uL (ref 4.0–10.5)

## 2016-04-16 LAB — HEPATIC FUNCTION PANEL
ALBUMIN: 4.1 g/dL (ref 3.5–5.2)
ALT: 22 U/L (ref 0–53)
AST: 21 U/L (ref 0–37)
Alkaline Phosphatase: 97 U/L (ref 39–117)
Bilirubin, Direct: 0.1 mg/dL (ref 0.0–0.3)
Total Bilirubin: 0.4 mg/dL (ref 0.2–1.2)
Total Protein: 6.7 g/dL (ref 6.0–8.3)

## 2016-04-16 LAB — LIPID PANEL
Cholesterol: 166 mg/dL (ref 0–200)
HDL: 37.5 mg/dL — ABNORMAL LOW (ref >39.00)
LDL Cholesterol: 102 mg/dL — ABNORMAL HIGH (ref 0–99)
NonHDL: 128.07
Total CHOL/HDL Ratio: 4
Triglycerides: 130 mg/dL (ref 0.0–149.0)
VLDL: 26 mg/dL (ref 0.0–40.0)

## 2016-04-16 LAB — TSH: TSH: 2.48 u[IU]/mL (ref 0.35–4.50)

## 2016-04-23 ENCOUNTER — Ambulatory Visit (INDEPENDENT_AMBULATORY_CARE_PROVIDER_SITE_OTHER): Payer: 59 | Admitting: Family Medicine

## 2016-04-23 ENCOUNTER — Encounter: Payer: Self-pay | Admitting: Family Medicine

## 2016-04-23 VITALS — BP 108/76 | HR 84 | Temp 98.1°F | Ht 69.0 in | Wt 209.0 lb

## 2016-04-23 DIAGNOSIS — Z0000 Encounter for general adult medical examination without abnormal findings: Secondary | ICD-10-CM

## 2016-04-23 DIAGNOSIS — Z1283 Encounter for screening for malignant neoplasm of skin: Secondary | ICD-10-CM

## 2016-04-23 NOTE — Addendum Note (Signed)
Addended by: [name] on: 04/23/2016 10:11 PM   Modules accepted: Miquel Dunn

## 2016-04-23 NOTE — Progress Notes (Signed)
Pre visit review using our clinic review tool, if applicable. No additional management support is needed unless otherwise documented below in the visit note. 

## 2016-04-23 NOTE — Patient Instructions (Addendum)
We will call you within a week about your referral to dermatology. If you do not hear within 2 weeks, give [place] a call.   For low back issues-  1. Try to do core strengthening 2-3x a week (youtube videos would be ok to use) 2. Consider massage 3. Try standing desk  Triglycerides look much better!   Would love to see you in a year moving back toward weight of 190, long term we may push for lower but this is a reasonable goal for now

## 2016-04-23 NOTE — Progress Notes (Signed)
Phone: [phone]  Subjective:  Patient presents today for their annual physical. Chief complaint-noted.   See problem oriented charting- ROS- full  review of systems was completed and negative except including No chest pain or shortness of breath. No headache or blurry vision.   The following were reviewed and entered/updated in epic: Past Medical History  Diagnosis Date  . ALLERGIC RHINITIS 08/29/2007  . BACK PAIN 01/08/2009  . Cough 01/27/2008  . Dysuria 01/19/2011  . GASTROENTERITIS, ACUTE 01/08/2009  . GERD 08/29/2007  . HEARING LOSS 05/10/2008  . 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 SKIN LESION 10/28/2010  . Wheezing 10/22/2009  . Shingles outbreak 06/24/2011   Patient Active Problem List   Diagnosis Date Noted  . Hypertriglyceridemia 10/06/2011    Priority: Medium  . Erectile dysfunction 07/31/2014    Priority: Low  . Allergic rhinitis 08/29/2007    Priority: Low  . GERD 08/29/2007    Priority: Low  . CYST, PILONIDAL W/ABSCESS 08/29/2007    Priority: Low   Past Surgical History  Procedure Laterality Date  . Pilonidal cystectomy      Family History  Problem Relation Age of Onset  . Depression Other   . Coronary artery disease Father     68 at diagnosis. 12 at death- MI complications. father was smoker  . Valvular heart disease Father     rheumatic fever- replaced  . Lung cancer Mother     73    Medications- reviewed and updated Current Outpatient Prescriptions  Medication Sig Dispense Refill  . ibuprofen (ADVIL,MOTRIN) 200 MG tablet Take 200 mg by mouth every 6 (six) hours as needed for fever or moderate pain. Reported on 02/11/2016    . Multiple Vitamin (MULTIVITAMIN) tablet Take 1 tablet by mouth daily. Reported on 02/11/2016    . tadalafil (CIALIS) 20 MG tablet Take 1 tablet (20 mg total) by mouth daily as needed for erectile dysfunction. 3 tablet 0  . vitamin B-12 (CYANOCOBALAMIN) 1000 MCG tablet Take 1,000 mcg by mouth daily.     No current  facility-administered medications for this visit.    Allergies-reviewed and updated No Known Allergies  Social History   Social History  . Marital Status: Married    Spouse Name: N/A  . Number of Children: 1  . Years of Education: N/A   Occupational History  . [employer]   Social History Main Topics  . Smoking status: Never Smoker   . Smokeless tobacco: None  . Alcohol Use: 3.6 oz/week    6 Standard drinks or equivalent per week  . Drug Use: No  . Sexual Activity: Not Asked   Other Topics Concern  . None   Social History Narrative   Married 2008 with 4 kids (8/6/3/7 months in 2017)   8,6,3, 10 months. G, B, G, G.       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 Oropharynx normal Neck: no thyromegaly CV: RRR no murmurs rubs or gallops Lungs: CTAB no crackles, wheeze, rhonchi Abdomen: soft/nontender/nondistended/normal bowel sounds. No rebound or guarding.  Ext: no edema Skin: warm, dry, no rash. Full skin exam deferred to dermatology.  Neuro: grossly normal, moves all extremities, PERRLA  Rectal declined until 50  Assessment/Plan:  45 y.o. male presenting for annual physical.  Health Maintenance counseling: 1. Anticipatory guidance: Patient counseled regarding regular dental exams, eye exams, wearing seatbelts.  2. Risk factor reduction:  Advised patient of need for regular exercise and diet rich and fruits and vegetables to reduce risk of heart attack and stroke. Discussed focusing on core exercises to help low back but general weight loss should help.  3.  Immunizations/screenings/ancillary studies Immunization History  Administered Date(s) Administered  . Influenza Split 09/20/2012  . Influenza-Unspecified 09/16/2011  . Td 08/16/2009   4. Prostate cancer screening- no family history, start age 5  5. Colon cancer screening - no family history, start at age 67 6. Skin cancer screening- has not had dermatology visit and requests referral- done today  Screening for skin cancer - Plan: Ambulatory referral to Dermatology   Status of chronic or acute concerns  Low back pain. LIke when clips toenails over toilet- back catches and struggles to get back up. Certain days no restrictions, other days flares up. Wakes up with low back pain at times. Blanket under back may help. Wrote rx to trial standing chair at [facility] as worse when sits prolonged periods.   Return in about 1 year (around 04/23/2017) for physical. Return precautions advised.   Orders Placed This Encounter  Procedures  . Ambulatory referral to Dermatology    Referral Priority:  Routine    Referral Type:  Consultation    Referral Reason:  Specialty Services Required    Requested Specialty:  Dermatology    Number of Visits Requested:  1   [name], MD

## 2016-06-29 ENCOUNTER — Encounter: Payer: Self-pay | Admitting: Family Medicine

## 2016-07-03 MED ORDER — TADALAFIL 20 MG PO TABS: 20.0000 mg | ORAL_TABLET | Freq: Every day | ORAL | 11 refills | Status: DC | PRN

## 2016-11-11 ENCOUNTER — Encounter: Payer: Self-pay | Admitting: Family Medicine

## 2016-12-23 ENCOUNTER — Encounter: Payer: Self-pay | Admitting: Family Medicine

## 2016-12-25 ENCOUNTER — Other Ambulatory Visit: Payer: Self-pay

## 2016-12-25 MED ORDER — TADALAFIL 20 MG PO TABS: 20.0000 mg | ORAL_TABLET | Freq: Every day | ORAL | 11 refills | Status: DC | PRN

## 2017-05-13 ENCOUNTER — Encounter: Payer: Self-pay | Admitting: Family Medicine

## 2017-05-13 ENCOUNTER — Ambulatory Visit (INDEPENDENT_AMBULATORY_CARE_PROVIDER_SITE_OTHER): Payer: 59 | Admitting: Family Medicine

## 2017-05-13 VITALS — BP 120/78 | HR 92 | Temp 98.9°F | Wt 207.4 lb

## 2017-05-13 DIAGNOSIS — J309 Allergic rhinitis, unspecified: Secondary | ICD-10-CM

## 2017-05-13 DIAGNOSIS — R062 Wheezing: Secondary | ICD-10-CM | POA: Diagnosis not present

## 2017-05-13 DIAGNOSIS — R05 Cough: Secondary | ICD-10-CM

## 2017-05-13 DIAGNOSIS — R059 Cough, unspecified: Secondary | ICD-10-CM

## 2017-05-13 MED ORDER — BENZONATATE 100 MG PO CAPS: 100.0000 mg | ORAL_CAPSULE | Freq: Three times a day (TID) | ORAL | 0 refills | Status: DC

## 2017-05-13 MED ORDER — AZITHROMYCIN 250 MG PO TABS: ORAL_TABLET | ORAL | 0 refills | Status: DC

## 2017-05-13 MED ORDER — PREDNISONE 10 MG PO TABS: ORAL_TABLET | ORAL | 0 refills | Status: DC

## 2017-05-13 MED ORDER — HYDROCODONE-HOMATROPINE 5-1.5 MG/5ML PO SYRP: 5.0000 mL | ORAL_SOLUTION | Freq: Three times a day (TID) | ORAL | 0 refills | Status: DC | PRN

## 2017-05-13 MED ORDER — ALBUTEROL SULFATE (2.5 MG/3ML) 0.083% IN NEBU: 2.5000 mg | INHALATION_SOLUTION | Freq: Once | RESPIRATORY_TRACT | Status: DC

## 2017-05-13 MED ORDER — ALBUTEROL SULFATE (2.5 MG/3ML) 0.083% IN NEBU
2.5000 mg | INHALATION_SOLUTION | Freq: Once | RESPIRATORY_TRACT | Status: AC
  Administered 2017-05-13: 2.5 mg via RESPIRATORY_TRACT

## 2017-05-13 NOTE — Progress Notes (Signed)
Subjective:    Patient ID: Tanner [name], male    DOB: August 12, 1971, 46 y.o.   MRN: [id number]  HPI  Tanner [name] is a 46 year old male who presents today with a cough  Onset: One week  Trigger: URI symptoms  Course: Worsening  Treatment/efficacy: Halls cough drops and tylenol which has provided limited benefit  Associated symptoms: Fever/Chills/Sweats: No Facial Pain: No Frontal Headaches: No Nasal congestion/purulence: Yes; clear to yellow Dental pain: No Sore throat: Yes for 4 days but this has improved and is not present today. Ear pain/pressure: No Itchy/watery eyes: No Cough: Productive with yellow/green and wakes him at night Pleuritic pain: No Dyspnea: No Wheezing: No  ACE Inhibitor: No History of asthma/bronchitis/COPD: No History of allergies Recent sick contact exposure: No Recent antibiotic use: No GI symptoms of dyspepsia, dysphagia, or reflux: No  Prior history of "coughing episodes" that have been similar in the past. He reports symptoms were greatly improved with prednisone. Last night he stated that he had an "episode" that lasted 5 minutes and he had difficulty sleeping due to coughing.  Review of Systems  Constitutional: Negative for chills, fatigue and fever.  HENT: Positive for congestion, postnasal drip and rhinorrhea. Negative for sinus pain, sinus pressure, sneezing, sore throat and tinnitus.   Respiratory: Positive for cough. Negative for shortness of breath and wheezing.   Cardiovascular: Negative for chest pain and palpitations.  Gastrointestinal: Negative for abdominal pain, constipation, diarrhea, nausea and vomiting.  Musculoskeletal: Negative for myalgias.  Skin: Negative for rash.  Neurological: Negative for dizziness, light-headedness, numbness and headaches.   Past Medical History:  Diagnosis Date  . ALLERGIC RHINITIS 08/29/2007  . BACK PAIN 01/08/2009  . Cough 01/27/2008  . Dysuria 01/19/2011  . GASTROENTERITIS, ACUTE 01/08/2009  .  GERD 08/29/2007  . HEARING LOSS 05/10/2008  . 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 Shingles outbreak 06/24/2011  . SKIN LESION 10/28/2010  . Wheezing 10/22/2009     Social History   Social History  . Marital status: Married    Spouse name: N/A  . Number of children: 1  . Years of education: N/A   Occupational History  . [employer]   Social History Main Topics  . Smoking status: Never Smoker  . Smokeless tobacco: Never Used  . Alcohol use 3.6 oz/week    6 Standard drinks or equivalent per week  . Drug use: No  . Sexual activity: Not on file   Other Topics Concern  . Not on file   Social History Narrative   Married 2008 with 4 kids (8/6/3/7 months in 2017)   8,6,3, 10 months. G, B, G, G.       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 pilonidal cystectomy      Family History  Problem Relation Age of Onset  . Depression Other   . Coronary artery disease Father        65 at diagnosis. 51 at death- MI complications. father was smoker  . Valvular heart disease Father        rheumatic fever- replaced  . Lung cancer Mother        60    No Known Allergies  Current Outpatient Prescriptions on File Prior  to Visit  Medication Sig Dispense Refill  . Multiple Vitamin (MULTIVITAMIN) tablet Take 1 tablet by mouth daily. Reported on 02/11/2016    . tadalafil (CIALIS) 20 MG tablet Take 1 tablet (20 mg total) by mouth daily as needed for erectile dysfunction. 5 tablet 11  . vitamin B-12 (CYANOCOBALAMIN) 1000 MCG tablet Take 1,000 mcg by mouth daily.    Marland Kitchen ibuprofen (ADVIL,MOTRIN) 200 MG tablet Take 200 mg by mouth every 6 (six) hours as needed for fever or moderate pain. Reported on 02/11/2016     No current facility-administered medications on file prior  to visit.     BP 120/78 (BP Location: Left Arm, Patient Position: Sitting, Cuff Size: Normal)   Pulse 92   Temp 98.9 F (37.2 C) (Oral)   Wt 207 lb 6.4 oz (94.1 kg)   SpO2 96%   BMI 30.63 kg/m       Objective:   Physical Exam  Constitutional: He is oriented to person, place, and time. He appears well-developed and well-nourished.  HENT:  Right Ear: Tympanic membrane normal.  Left Ear: Tympanic membrane normal.  Nose: Rhinorrhea present. Right sinus exhibits no maxillary sinus tenderness and no frontal sinus tenderness. Left sinus exhibits no maxillary sinus tenderness and no frontal sinus tenderness.  Mouth/Throat: Oropharynx is clear and moist and mucous membranes are normal. No oropharyngeal exudate or posterior oropharyngeal erythema.  Eyes: Pupils are equal, round, and reactive to light. No scleral icterus.  Neck: Neck supple.  Cardiovascular: Normal rate and regular rhythm.   Pulmonary/Chest: Effort normal. He has wheezes. He has no rales.  Abdominal: Soft. Bowel sounds are normal. There is no tenderness.  Musculoskeletal: He exhibits no edema.  Lymphadenopathy:    He has no cervical adenopathy.  Neurological: He is alert and oriented to person, place, and time. Coordination normal.  Skin: Skin is warm and dry. No rash noted.  Psychiatric: He has a normal mood and affect. His behavior is normal. Judgment and thought content normal.       Assessment & Plan:  1. Cough Symptoms worsening; prior history of cough that required treatment with steroid IM and PO; symptom duration and worsening symptoms will treat with Z-pack and provide benzonatate for cough during day and Hycodan at night as needed. Advised close follow up if symptoms do not improve with treatment, can consider cxr if no improvement;  - benzonatate (TESSALON) 100 MG capsule; Take 1 capsule (100 mg total) by mouth 3 (three) times daily.  Dispense: 20 capsule; Refill: 0 - azithromycin (ZITHROMAX) 250 MG tablet;  Take 2 tablets by mouth at once today, then one tablet daily for 4 days.  Dispense: 6 tablet; Refill: 0 - HYDROcodone-homatropine (HYCODAN) 5-1.5 MG/5ML syrup; Take 5 mLs by mouth every 8 (eight) hours as needed for cough.  Dispense: 120 mL; Refill: 0  2. Wheezing  - predniSONE (DELTASONE) 10 MG tablet; Take 4 tablets daily for 4 days, 3 tabs daily for 2 days, 2 tabs daily for 2 days, and one tab daily for 2 days.  Dispense: 28 tablet; Refill: 0  3. Allergic rhinitis, unspecified seasonality, unspecified trigger  Delano Metz, FNP-C

## 2017-05-13 NOTE — Patient Instructions (Signed)
Please take medication as directed with food. If symptoms do not improve in 3 to 4 days, worsen, or you develop a fever >100, follow up with your provider.

## 2017-05-20 ENCOUNTER — Telehealth: Payer: Self-pay | Admitting: Family Medicine

## 2017-05-20 ENCOUNTER — Other Ambulatory Visit: Payer: Self-pay

## 2017-05-20 DIAGNOSIS — R059 Cough, unspecified: Secondary | ICD-10-CM

## 2017-05-20 DIAGNOSIS — R05 Cough: Secondary | ICD-10-CM

## 2017-05-20 NOTE — Telephone Encounter (Signed)
Called patient left a message to return my call in the office in regards to his Chest X -ray order that he requested.

## 2017-05-20 NOTE — Telephone Encounter (Signed)
Pt calling back wanting to go ahead with the chest xray due to him not feeling any better still cough.

## 2017-05-20 NOTE — Telephone Encounter (Signed)
An order for DG Chest X-ray has been placed with [facility].

## 2017-05-21 ENCOUNTER — Encounter: Payer: Self-pay | Admitting: Family Medicine

## 2017-05-21 ENCOUNTER — Ambulatory Visit (INDEPENDENT_AMBULATORY_CARE_PROVIDER_SITE_OTHER)
Admission: RE | Admit: 2017-05-21 | Discharge: 2017-05-21 | Disposition: A | Discharge status: Home / Self Care | Payer: 59 | Source: Ambulatory Visit | Attending: Family Medicine | Admitting: Family Medicine

## 2017-05-21 DIAGNOSIS — R05 Cough: Secondary | ICD-10-CM

## 2017-05-21 DIAGNOSIS — R059 Cough, unspecified: Secondary | ICD-10-CM

## 2017-05-21 NOTE — Telephone Encounter (Signed)
Got a message from patient stating that he had his chest X- RAY at [facility] and wants a call when the results are in.

## 2017-08-27 DIAGNOSIS — Z23 Encounter for immunization: Secondary | ICD-10-CM | POA: Diagnosis not present

## 2017-11-17 ENCOUNTER — Ambulatory Visit: Payer: Self-pay | Admitting: *Deleted

## 2017-11-17 NOTE — Telephone Encounter (Signed)
See note

## 2017-11-17 NOTE — Telephone Encounter (Signed)
Patient states he thinks he injured himself raking and shoveling. He hurts when he bends down or takes a deep breath in on his L under his rib cage. Reason for Disposition . [1] MODERATE pain (e.g., interferes with normal activities) AND [2] present > 3 days  Answer Assessment - Initial Assessment Questions 1. MECHANISM: "How did the injury happen?"     Patient thinks he may have injured himself shoveling and lifting 2. ONSET: "When did the injury happen?" (e.g., minutes, hours, days ago)     3 weeks 3. LOCATION: "Where on the chest is the injury located?" "Where does it hurt?"     Left side under rib- some tenderness L sternum 4. CHEST OR RIB PAIN SEVERITY: "How bad is the pain?"  (e.g., Scale 1-10; mild, moderate, or severe)    - MILD (1-3): doesn't interfere with normal activities     - MODERATE (4-7): interferes with normal activities or awakens from sleep    - SEVERE (8-10): excruciating pain, unable to do any normal activities       Moderate- if takes deep breath or bends over feels it 5. BREATHING DIFFICULTY: "Are you having any difficulty breathing?" If so, ask "How bad is it?"  (e.g., none, mild, moderate, severe) if expands chest can feel it 6: OTHER SYMPTOMS (e.g., cough, fever, rash)      no 7. PREGNANCY: "Is there any chance you are pregnant?" "When was your last menstrual period?"     n/a  Protocols used: CHEST INJURY - BENDING, LIFTING, OR TWISTING-A-AH

## 2017-11-17 NOTE — Telephone Encounter (Signed)
noted 

## 2017-11-19 ENCOUNTER — Ambulatory Visit: Payer: 59 | Admitting: Family Medicine

## 2017-11-19 ENCOUNTER — Encounter: Payer: Self-pay | Admitting: Family Medicine

## 2017-11-19 ENCOUNTER — Ambulatory Visit (INDEPENDENT_AMBULATORY_CARE_PROVIDER_SITE_OTHER): Payer: 59

## 2017-11-19 VITALS — BP 122/84 | HR 57 | Ht 69.0 in | Wt 215.4 lb

## 2017-11-19 DIAGNOSIS — R0781 Pleurodynia: Secondary | ICD-10-CM | POA: Diagnosis not present

## 2017-11-19 DIAGNOSIS — R0789 Other chest pain: Secondary | ICD-10-CM | POA: Diagnosis not present

## 2017-11-19 MED ORDER — MELOXICAM 15 MG PO TABS: 15.0000 mg | ORAL_TABLET | Freq: Every day | ORAL | 0 refills | Status: DC

## 2017-11-19 NOTE — Progress Notes (Signed)
Subjective:  Tanner [name] is a 47 y.o. year old very pleasant male patient who presents for/with See problem oriented charting ROS- no fever, chills, nausea, vomiting. No cough, congestion.    Past Medical History-  Patient Active Problem List   Diagnosis Date Noted  . Hypertriglyceridemia 10/06/2011    Priority: Medium  . Erectile dysfunction 07/31/2014    Priority: Low  . Allergic rhinitis 08/29/2007    Priority: Low  . GERD 08/29/2007    Priority: Low  . CYST, PILONIDAL W/ABSCESS 08/29/2007    Priority: Low    Medications- reviewed and updated Current Outpatient Medications  Medication Sig Dispense Refill  . Multiple Vitamin (MULTIVITAMIN) tablet Take 1 tablet by mouth daily. Reported on 02/11/2016    . tadalafil (CIALIS) 20 MG tablet Take 1 tablet (20 mg total) by mouth daily as needed for erectile dysfunction. 5 tablet 11  . vitamin B-12 (CYANOCOBALAMIN) 1000 MCG tablet Take 1,000 mcg by mouth daily.     No current facility-administered medications for this visit.     Objective: BP 122/84 (BP Location: Left Arm, Patient Position: Sitting, Cuff Size: Normal)   Pulse (!) 57   Ht 5' 9"  (1.753 m)   Wt 215 lb 6.4 oz (97.7 kg)   SpO2 97%   BMI 31.81 kg/m  Gen: NAD, resting comfortably CV: RRR no murmurs rubs or gallops Pain over left chest wall along 2 ribs- where marker is placed on x-ray. Some slight discomfort here with very deep breath Lungs: CTAB no crackles, wheeze, rhonchi Abdomen: soft/nontender/nondistended/normal bowel sounds. Ext: no edema Skin: warm, dry  Dg Ribs Unilateral W/chest Left  Result Date: 11/19/2017 CLINICAL DATA:  Left-sided chest pain. EXAM: LEFT RIBS AND CHEST - 3+ VIEW COMPARISON:  05/21/2017. FINDINGS: No evidence of displaced rib fracture. No focal bony abnormality. No pneumothorax. IMPRESSION: No acute or focal abnormality identified. Electronically Signed   By: Marcello Moores  Register   On: 11/19/2017 16:23     Assessment/Plan:  Rib pain on  left side - Plan: DG Ribs Unilateral W/Chest Left S: Patient started with pain after the snow storm last month and doing a lot of heavy shoveling and also doing a fair amount of work in his yard. Has sharp shooting pains in left chest particularly when coughing, sneezing, or getting up. Movement seems to be the most common trigger. This area is also tender to palpation without bruising or swelling. Pain worsened over the past week- played volleyball over the weekend. He wondered if he had fractured something due to intensity of pain. Seems to come and go. No fall or injury. Did have a long drive but after this started. Some pain with deep breath. Has not tried nsaids or tylenol yet. Pain can be at least moderate intensity  No shortness of breath. No regular cough or sneeze. No Leg swelling or wheezing.  A/P: patient worried about rib fracture but films fortunately negative.  likely costochondritis (possible pleurisy but pain on palpation points away). 10 day mobic trial. Return precautions discussed as well as reasons for return to care outpatient.   Orders Placed This Encounter  Procedures  . DG Ribs Unilateral W/Chest Left    Standing Status:   Future    Number of Occurrences:   1    Standing Expiration Date:   12/20/2017    Order Specific Question:   Reason for Exam (SYMPTOM  OR DIAGNOSIS REQUIRED)    Answer:   LT-sided rib pain    Order Specific Question:  Preferred imaging location?    Answer:   [place] ordered this encounter  Medications  . meloxicam (MOBIC) 15 MG tablet    Sig: Take 1 tablet (15 mg total) by mouth daily.    Dispense:  10 tablet    Refill:  0    Return precautions advised.  [name], MD

## 2017-11-19 NOTE — Patient Instructions (Signed)
Take mobic for next 10 days. If you have new or worsening symptoms particularly fever or shortness of breath return to see [place].   X-ray reassuring but will await official read from radiology as well   Costochondritis Costochondritis is swelling and irritation (inflammation) of the tissue (cartilage) that connects your ribs to your breastbone (sternum). This causes pain in the front of your chest. Usually, the pain:  Starts gradually and can worsen  Can be moderate to severe  This condition usually goes away on its own over time. Follow these instructions at home:  Do not do anything that makes your pain worse.  If directed, put ice on the painful area: ? Put ice in a plastic bag. ? Place a towel between your skin and the bag. ? Leave the ice on for 20 minutes, 2-3 times a day.  If directed, put heat on the affected area as often as told by your doctor. Use the heat source that your doctor tells you to use, such as a moist heat pack or a heating pad. ? Place a towel between your skin and the heat source. ? Leave the heat on for 20-30 minutes. ? Take off the heat if your skin turns bright red. This is very important if you cannot feel pain, heat, or cold. You may have a greater risk of getting burned.  Take over-the-counter and prescription medicines only as told by your doctor.  Return to your normal activities as told by your doctor. Ask your doctor what activities are safe for you.  Keep all follow-up visits as told by your doctor. This is important. Contact a doctor if:  You have chills or a fever.  Your pain does not go away or it gets worse.  You have a cough that does not go away. Get help right away if:  You are short of breath. This information is not intended to replace advice given to you by your health care provider. Make sure you discuss any questions you have with your health care provider. Document Released: 04/20/2008 Document Revised: 05/22/2016 Document  Reviewed: 02/26/2016 Elsevier Interactive Patient Education  [employer].

## 2018-05-02 ENCOUNTER — Encounter: Payer: Self-pay | Admitting: Family Medicine

## 2018-05-06 ENCOUNTER — Ambulatory Visit: Payer: 59 | Admitting: Family Medicine

## 2018-05-06 ENCOUNTER — Encounter: Payer: Self-pay | Admitting: Family Medicine

## 2018-05-06 VITALS — BP 108/82 | HR 64 | Temp 98.2°F | Ht 69.0 in | Wt 223.6 lb

## 2018-05-06 DIAGNOSIS — K625 Hemorrhage of anus and rectum: Secondary | ICD-10-CM | POA: Diagnosis not present

## 2018-05-06 DIAGNOSIS — E781 Pure hyperglyceridemia: Secondary | ICD-10-CM | POA: Diagnosis not present

## 2018-05-06 DIAGNOSIS — Z0000 Encounter for general adult medical examination without abnormal findings: Secondary | ICD-10-CM | POA: Diagnosis not present

## 2018-05-06 NOTE — Patient Instructions (Addendum)
Schedule a lab visit at the check out desk within a week if possible. Return for future fasting labs meaning nothing but water after midnight please. Ok to take your medications with water.   You could actually go ahead and pick up stool cards from the lab today though and then bring those back when you come for labs. If you had blood in stool would refer you to the stomach doctors  Reasonable to go ahead and schedule a physical for next year

## 2018-05-06 NOTE — Progress Notes (Signed)
Phone: [phone]  Subjective:  Patient presents today for their annual physical. Chief complaint-noted.   See problem oriented charting- ROS- full  review of systems was completed and negative except for: back pain, anal bleeding with wiping  The following were reviewed and entered/updated in epic: Past Medical History:  Diagnosis Date  . ALLERGIC RHINITIS 08/29/2007  . BACK PAIN 01/08/2009  . Cough 01/27/2008  . Dysuria 01/19/2011  . GASTROENTERITIS, ACUTE 01/08/2009  . GERD 08/29/2007  . HEARING LOSS 05/10/2008  . 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 Shingles outbreak 06/24/2011  . SKIN LESION 10/28/2010  . Wheezing 10/22/2009   Patient Active Problem List   Diagnosis Date Noted  . Hypertriglyceridemia 10/06/2011    Priority: Medium  . Erectile dysfunction 07/31/2014    Priority: Low  . Allergic rhinitis 08/29/2007    Priority: Low  . GERD 08/29/2007    Priority: Low  . CYST, PILONIDAL W/ABSCESS 08/29/2007    Priority: Low   Past Surgical History:  Procedure Laterality Date  . pilonidal cystectomy      Family History  Problem Relation Age of Onset  . Depression Other   . Coronary artery disease Father        59 at diagnosis. 21 at death- MI complications. father was smoker  . Valvular heart disease Father        rheumatic fever- replaced  . Lung cancer Mother        67    Medications- reviewed and updated Current Outpatient Medications  Medication Sig Dispense Refill  . Multiple Vitamin (MULTIVITAMIN) tablet Take 1 tablet by mouth daily. Reported on 02/11/2016    . tadalafil (CIALIS) 20 MG tablet Take 1 tablet (20 mg total) by mouth daily as needed for erectile dysfunction. 5 tablet 11  . vitamin B-12 (CYANOCOBALAMIN) 1000 MCG tablet Take 1,000 mcg by mouth daily.     No current facility-administered medications for this visit.     Allergies-reviewed and updated No Known Allergies  Social History   Social History Narrative   Married 2008 with 4 kids  (8/6/3/7 months in 2017). In 2019- wife pregnant with 5th child 2019.    8,6,3, 10 months. G, B, G, G.       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 Works with Health and safety inspector: time with kids at parks, playing in yard    Objective: BP 108/82 (BP Location: Left Arm, Patient Position: Sitting, Cuff Size: Large)   Pulse 64   Temp 98.2 F (36.8 C) (Oral)   Ht 5' 9"  (1.753 m)   Wt 223 lb 9.6 oz (101.4 kg)   SpO2 96%   BMI 33.02 kg/m  Gen: NAD, resting comfortably HEENT: Mucous membranes are moist. Oropharynx normal Neck: no thyromegaly CV: RRR no murmurs rubs or gallops Lungs: CTAB no crackles, wheeze, rhonchi Abdomen: soft/nontender/nondistended/normal bowel sounds. No rebound or guarding.  Ext: no edema Skin: warm, dry Neuro: grossly normal, moves all extremities, PERRLA Rectal: normal tone, normal sized prostate, no masses or tenderness  Assessment/Plan:  47 y.o. male presenting for annual physical.  Health Maintenance counseling: 1. Anticipatory guidance: Patient counseled regarding regular dental exams -q6 months, eye exams - planned in july, wearing seatbelts.  2. Risk factor reduction:  Advised patient of need for regular exercise and diet rich and fruits and vegetables to reduce risk of heart attack and stroke. Exercise- last visit we discussed core strengthening to  help with his low back pain - he has been working on that recently at the gym. He is doing some treadmill  8 mins, weights- he thinks he may be making some mild progress- he feels stronger with weight being up. Diet-we discussed improving diet in addition to his working out at the gym- needs to work on portion size particularly at dinner. And watching intake after dinner- try to have healthier options like fruit Wt Readings from Last 3 Encounters:  05/06/18 223 lb 9.6 oz (101.4 kg)  11/19/17 215 lb 6.4 oz (97.7 kg)  05/13/17 207 lb 6.4 oz  (94.1 kg)  3. Immunizations/screenings/ancillary studies- up to date Immunization History  Administered Date(s) Administered  . Influenza Split 09/20/2012  . Influenza-Unspecified 09/16/2011  . Td 08/16/2009  4. Prostate cancer screening- no family history, start at age 65. Rectal exam reassuring- done due to rectal bleeding 5. Colon cancer screening - no family history, start at age 73. Discussed some guidelines suggesting 45 but unclear of insurance coverage for colonoscopy. We will get stool cards as likely cheaper to rule out any bleeding. If positive would refer to GI 6. Skin cancer screening/prevention- referred to dermatology last visit - no issues- . advised regular sunscreen use. Denies worrisome, changing, or new skin lesions.  7. Testicular cancer screening- advised monthly self exams  8. STD screening- patient opts out - monogamous   Status of chronic or acute concerns   Has difficulty with stool- when he wipes has difficulty getting all of the stool cleaned without making himself bleed (rectal bleeding)- even with the aid of cottonelle wipes- though it is better wit them. Tends to get some itching as well. Has had on and off issues for 2 years- used to use preparation H on q tip and would help some.  - this is likely local irritation from wiping but we will get stool cards to be on safe side  Low back pain. Still needs a blanket or sheet under neath his back at night. Wears slippers on hard floor to keep back from tightening. Still gets issues doing things like clipping toe nails. Not bad but does bother him some. Doing some core strengthening working out to see if that can improve things. He never got the standing desk.   Return in about 1 year (around 05/07/2019) for physical.  Lab/Order associations: Preventative health care - Plan: CBC, Comprehensive metabolic panel, Lipid panel, POCT Urinalysis Dipstick (Automated), Fecal occult blood, imunochemical  Hypertriglyceridemia -  Plan: CBC, Comprehensive metabolic panel, Lipid panel, POCT Urinalysis Dipstick (Automated)  Rectal bleeding - Plan: Fecal occult blood, imunochemical  Return precautions advised.  [name], MD

## 2018-05-10 ENCOUNTER — Other Ambulatory Visit (INDEPENDENT_AMBULATORY_CARE_PROVIDER_SITE_OTHER): Payer: 59

## 2018-05-10 DIAGNOSIS — Z0000 Encounter for general adult medical examination without abnormal findings: Secondary | ICD-10-CM | POA: Diagnosis not present

## 2018-05-10 DIAGNOSIS — E781 Pure hyperglyceridemia: Secondary | ICD-10-CM | POA: Diagnosis not present

## 2018-05-10 LAB — POC URINALSYSI DIPSTICK (AUTOMATED)
BILIRUBIN UA: NEGATIVE
Blood, UA: NEGATIVE
Glucose, UA: NEGATIVE
Ketones, UA: NEGATIVE
Leukocytes, UA: NEGATIVE
NITRITE UA: NEGATIVE
PH UA: 6 (ref 5.0–8.0)
Protein, UA: NEGATIVE
Spec Grav, UA: 1.01 (ref 1.010–1.025)
Urobilinogen, UA: 0.2 E.U./dL

## 2018-05-10 LAB — LIPID PANEL
Cholesterol: 162 mg/dL (ref 0–200)
HDL: 36 mg/dL — ABNORMAL LOW (ref >39.00)
LDL Cholesterol: 94 mg/dL (ref 0–99)
NONHDL: 126.1
Total CHOL/HDL Ratio: 5
Triglycerides: 160 mg/dL — ABNORMAL HIGH (ref 0.0–149.0)
VLDL: 32 mg/dL (ref 0.0–40.0)

## 2018-05-10 LAB — COMPREHENSIVE METABOLIC PANEL
ALK PHOS: 111 U/L (ref 39–117)
ALT: 29 U/L (ref 0–53)
AST: 26 U/L (ref 0–37)
Albumin: 4.1 g/dL (ref 3.5–5.2)
BILIRUBIN TOTAL: 0.4 mg/dL (ref 0.2–1.2)
BUN: 17 mg/dL (ref 6–23)
CO2: 24 mEq/L (ref 19–32)
Calcium: 8.7 mg/dL (ref 8.4–10.5)
Chloride: 104 mEq/L (ref 96–112)
Creatinine, Ser: 1.1 mg/dL (ref 0.40–1.50)
GFR: 76.69 mL/min (ref >60.00)
Glucose, Bld: 90 mg/dL (ref 70–99)
POTASSIUM: 4 meq/L (ref 3.5–5.1)
SODIUM: 136 meq/L (ref 135–145)
TOTAL PROTEIN: 7 g/dL (ref 6.0–8.3)

## 2018-05-10 LAB — CBC
HCT: 45 % (ref 39.0–52.0)
HEMOGLOBIN: 15.6 g/dL (ref 13.0–17.0)
MCHC: 34.6 g/dL (ref 30.0–36.0)
MCV: 88.5 fl (ref 78.0–100.0)
Platelets: 192 10*3/uL (ref 150.0–400.0)
RBC: 5.08 Mil/uL (ref 4.22–5.81)
RDW: 13.5 % (ref 11.5–15.5)
WBC: 9.6 10*3/uL (ref 4.0–10.5)

## 2018-05-13 ENCOUNTER — Encounter: Payer: Self-pay | Admitting: Family Medicine

## 2018-08-30 ENCOUNTER — Encounter: Payer: Self-pay | Admitting: Family Medicine

## 2018-08-31 ENCOUNTER — Other Ambulatory Visit: Payer: Self-pay

## 2018-08-31 MED ORDER — TADALAFIL 20 MG PO TABS: 20.0000 mg | ORAL_TABLET | Freq: Every day | ORAL | 11 refills | Status: DC | PRN

## 2018-10-04 ENCOUNTER — Ambulatory Visit (INDEPENDENT_AMBULATORY_CARE_PROVIDER_SITE_OTHER): Payer: 59

## 2018-10-04 ENCOUNTER — Ambulatory Visit (INDEPENDENT_AMBULATORY_CARE_PROVIDER_SITE_OTHER): Payer: 59 | Admitting: Family Medicine

## 2018-10-04 ENCOUNTER — Encounter: Payer: Self-pay | Admitting: Family Medicine

## 2018-10-04 VITALS — BP 114/84 | HR 64 | Temp 97.7°F | Ht 69.0 in | Wt 222.6 lb

## 2018-10-04 DIAGNOSIS — M79671 Pain in right foot: Secondary | ICD-10-CM

## 2018-10-04 DIAGNOSIS — M722 Plantar fascial fibromatosis: Secondary | ICD-10-CM | POA: Diagnosis not present

## 2018-10-04 DIAGNOSIS — M19071 Primary osteoarthritis, right ankle and foot: Secondary | ICD-10-CM | POA: Diagnosis not present

## 2018-10-04 MED ORDER — DICLOFENAC SODIUM 75 MG PO TBEC: 75.0000 mg | DELAYED_RELEASE_TABLET | Freq: Two times a day (BID) | ORAL | 0 refills | Status: DC

## 2018-10-04 NOTE — Progress Notes (Signed)
Subjective:  Tanner [name] is a 47 y.o. year old very pleasant male patient who presents for/with See problem oriented charting ROS- no fever, chills, redness over foot, edema in lower leg   Past Medical History-  Patient Active Problem List   Diagnosis Date Noted  . Hypertriglyceridemia 10/06/2011    Priority: Medium  . Erectile dysfunction 07/31/2014    Priority: Low  . Allergic rhinitis 08/29/2007    Priority: Low  . GERD 08/29/2007    Priority: Low  . CYST, PILONIDAL W/ABSCESS 08/29/2007    Priority: Low    Medications- reviewed and updated Current Outpatient Medications  Medication Sig Dispense Refill  . Multiple Vitamin (MULTIVITAMIN) tablet Take 1 tablet by mouth daily. Reported on 02/11/2016    . tadalafil (ADCIRCA/CIALIS) 20 MG tablet Take 1 tablet (20 mg total) by mouth daily as needed for erectile dysfunction. 5 tablet 11  . vitamin B-12 (CYANOCOBALAMIN) 1000 MCG tablet Take 1,000 mcg by mouth daily.    . diclofenac (VOLTAREN) 75 MG EC tablet Take 1 tablet (75 mg total) by mouth 2 (two) times daily. 20 tablet 0   Objective: BP 114/84 (BP Location: Left Arm, Patient Position: Sitting, Cuff Size: Large)   Pulse 64   Temp 97.7 F (36.5 C) (Oral)   Ht 5' 9"  (1.753 m)   Wt 222 lb 9.6 oz (101 kg)   SpO2 96%   BMI 32.87 kg/m  Gen: NAD, resting comfortably CV: RRR no murmurs rubs or gallops Lungs: CTAB no crackles, wheeze, rhonchi Ext: no edema Skin: warm, dry MSK- patient very tender to palpation of insertion point of right plantar fascia on right foot.   Assessment/Plan:  Plantar fasciitis, right  Pain of right heel - Plan: DG Foot Complete Right S: has had right heel pain for about 3 weeks. Goes to the gym at least once a week but tries to go more than that- avoids high impact yesterday. On treadmill does walking. Doesn't think hes done anything to hurt it. Started hurting Tuesday afternoon. Has had sore feet from standing too long in the past but it just never  went away. Kids are making fun of him as he is hobbling to the bathroom. Feels tight in arch. Can walk on balls of foot. Has tried cool packs as well as heating pack on it- being off feet. Bothers him a lot once gets back up.   Pain level can get up to 6/10 but most of time around 4/10. Dull pain.  A/P: 47 year old male with right heel pain consistent with plantar fasciitis.  I personally reviewed his x-rays and saw no signs of calcaneal stress fracture.  Slight bone spur at the plantar fascia insertion point noted.  We will await radiology overread.  We discussed using a 10-day course of diclofenac.  We reviewed sports medicine advisor handout on plantar fasciitis-encouraged him to use tennis shoes more regularly and can consider gel heel cups report.  We discussed icing.  If patient is not significantly improved within 2 to 3 weeks- asked him to reach back out to [place] so we can consider referral to Dr. Paulla Fore of sports medicine   Future Appointments  Date Time Provider [place]  05/09/2019  8:20 AM [name], MD [facility]   Lab/Order associations: Plantar fasciitis, right  Pain of right heel - Plan: DG Foot Complete Right  Meds ordered this encounter  Medications  . diclofenac (VOLTAREN) 75 MG EC tablet    Sig: Take 1 tablet (  75 mg total) by mouth 2 (two) times daily.    Dispense:  20 tablet    Refill:  0   Return precautions advised.  [name], MD

## 2018-10-04 NOTE — Patient Instructions (Signed)
You have plantar fasciitis of your right heel.   Treat with diclofenac twice a day for 10 days to calm down inflammatoin  Also see handout and do those exercises/stretches 3x a week for a month then weekly for 1-2 more months  If you are not improving within 2-3 weeks or feel like your progress stagnates or symptoms worsen- send me a message through [place] and we can get you in with Dr. Paulla Fore of sports medicine to further evaluate/consider injection

## 2018-10-31 ENCOUNTER — Ambulatory Visit: Payer: 59 | Admitting: Physician Assistant

## 2018-10-31 DIAGNOSIS — Z0289 Encounter for other administrative examinations: Secondary | ICD-10-CM

## 2018-11-02 ENCOUNTER — Encounter: Payer: Self-pay | Admitting: Physician Assistant

## 2018-12-23 ENCOUNTER — Ambulatory Visit: Payer: 59 | Admitting: Family Medicine

## 2018-12-23 ENCOUNTER — Encounter: Payer: Self-pay | Admitting: Family Medicine

## 2018-12-23 VITALS — BP 100/70 | HR 77 | Temp 98.0°F | Ht 69.0 in | Wt 225.6 lb

## 2018-12-23 DIAGNOSIS — R058 Other specified cough: Secondary | ICD-10-CM

## 2018-12-23 DIAGNOSIS — R05 Cough: Secondary | ICD-10-CM

## 2018-12-23 MED ORDER — PREDNISONE 20 MG PO TABS: ORAL_TABLET | ORAL | 0 refills | Status: DC

## 2018-12-23 NOTE — Progress Notes (Signed)
Phone [phone]   Subjective:  Tanner [name] is a 48 y.o. year old very pleasant male patient who presents for/with See problem oriented charting ROS- no chest pain, shortness of breath, fever. Has had chest congestion and cough    Past Medical History-  Patient Active Problem List   Diagnosis Date Noted  . Hypertriglyceridemia 10/06/2011    Priority: Medium  . Erectile dysfunction 07/31/2014    Priority: Low  . Allergic rhinitis 08/29/2007    Priority: Low  . GERD 08/29/2007    Priority: Low  . CYST, PILONIDAL W/ABSCESS 08/29/2007    Priority: Low    Medications- reviewed and updated Current Outpatient Medications  Medication Sig Dispense Refill  . Multiple Vitamin (MULTIVITAMIN) tablet Take 1 tablet by mouth daily. Reported on 02/11/2016    . tadalafil (ADCIRCA/CIALIS) 20 MG tablet Take 1 tablet (20 mg total) by mouth daily as needed for erectile dysfunction. 5 tablet 11  . predniSONE (DELTASONE) 20 MG tablet Take 2 pills for 3 days, 1 pill for 4 days 10 tablet 0   No current facility-administered medications for this visit.      Objective:  BP 100/70 (BP Location: Left Arm, Patient Position: Sitting, Cuff Size: Large)   Pulse 77   Temp 98 F (36.7 C) (Oral)   Ht 5' 9"  (1.753 m)   Wt 225 lb 9.6 oz (102.3 kg)   SpO2 96%   BMI 33.32 kg/m  Gen: NAD, resting comfortably, intermittent dry cough Nasal turbinates and pharynx slightly erythematous. TM normal  CV: RRR no murmurs rubs or gallops Lungs: CTAB no crackles, wheeze, rhonchi Ext: no edema Skin: warm, dry    Assessment and Plan   Also updates me plantar fascia is doing better with inserts and exercises.   Cough S: Patient had an upper respiratory infection a few weeks ago Feels like cold is gone but cough has persisted Cough feels like it is deep in his chest. Has been using menthol lozenges Cough keeps him up at night Using vicks on his feet- suppresses cough some at night Does not feel fatigued or  short of breath Able to do most of his regular activities In total 10-12 days of symptoms Tends to have somewhat sensitive lungs Also with seasonal allergies  Patient wanted to rule out bronchitis and pneumonia A/P: From avs " Patient Instructions  I am hoping this is just a post viral cough and will clear with prednisone.  There is also the possibility of a mild bronchitis-this can last up to 4 to 6 weeks.  As we discussed there is a 95% chance that bronchitis is viral so antibiotics are not indicated unless there are new or worsening symptoms.  Take the prednisone for 7 days and if you are not improving by next week- you can send me a message and we can get you set up for a chest x-ray just to make sure no pneumonia but your lung exam was clear today and pointing away from this.  Let us know immediately if you develop shortness of breath, chest pain, fever with this. "   Future Appointments  Date Time Provider [name]  05/09/2019  8:20 AM [name], MD [facility]   Meds ordered this encounter  Medications  . predniSONE (DELTASONE) 20 MG tablet    Sig: Take 2 pills for 3 days, 1 pill for 4 days    Dispense:  10 tablet    Refill:  0    Return precautions  advised.  [name], MD

## 2018-12-23 NOTE — Patient Instructions (Addendum)
I am hoping this is just a post viral cough and will clear with prednisone.  There is also the possibility of a mild bronchitis-this can last up to 4 to 6 weeks.  As we discussed there is a 95% chance that bronchitis is viral so antibiotics are not indicated unless there are new or worsening symptoms.  Take the prednisone for 7 days and if you are not improving by next week- you can send me a message and we can get you set up for a chest x-ray just to make sure no pneumonia but your lung exam was clear today and pointing away from this.  Let us know immediately if you develop shortness of breath, chest pain, fever with this.

## 2018-12-30 ENCOUNTER — Ambulatory Visit: Payer: Self-pay

## 2018-12-30 DIAGNOSIS — R05 Cough: Secondary | ICD-10-CM

## 2018-12-30 DIAGNOSIS — R059 Cough, unspecified: Secondary | ICD-10-CM

## 2018-12-30 NOTE — Telephone Encounter (Signed)
Pt. Reports he is still coughing. Reports "I feel great, just can't get rid of this cough." Took all of the Prednisone. Coughing up clear mucus. Denies shortness of breath or chest pain.No fever.States he was to let Dr. Yong Channel know if the cough continued. Please advise pt.  Answer Assessment - Initial Assessment Questions 1. ONSET: "When did the cough begin?"      Weeks ago 2. SEVERITY: "How bad is the cough today?"      Severe 3. RESPIRATORY DISTRESS: "Describe your breathing."       No distress 4. FEVER: "Do you have a fever?" If so, ask: "What is your temperature, how was it measured, and when did it start?"     No 5. SPUTUM: "Describe the color of your sputum" (clear, white, yellow, green)     Clear 6. HEMOPTYSIS: "Are you coughing up any blood?" If so ask: "How much?" (flecks, streaks, tablespoons, etc.)     No 7. CARDIAC HISTORY: "Do you have any history of heart disease?" (e.g., heart attack, congestive heart failure)      No 8. LUNG HISTORY: "Do you have any history of lung disease?"  (e.g., pulmonary embolus, asthma, emphysema)     No 9. PE RISK FACTORS: "Do you have a history of blood clots?" (or: recent major surgery, recent prolonged travel, bedridden)     No 10. OTHER SYMPTOMS: "Do you have any other symptoms?" (e.g., runny nose, wheezing, chest pain)       Rattling sometimes 11. PREGNANCY: "Is there any chance you are pregnant?" "When was your last menstrual period?"       n/a 12. TRAVEL: "Have you traveled out of the country in the last month?" (e.g., travel history, exposures)       No  Protocols used: [place]

## 2018-12-30 NOTE — Telephone Encounter (Signed)
Message routed to PCP for advise

## 2018-12-30 NOTE — Telephone Encounter (Signed)
See note

## 2018-12-31 NOTE — Addendum Note (Signed)
Addended by: [name] on: 12/31/2018 12:33 PM   Modules accepted: Orders

## 2018-12-31 NOTE — Telephone Encounter (Signed)
Kendra-you asked me about this on Friday.  I recommended getting a chest x-ray for the patient on Friday under cough.  It does not appear this was set up.  I ordered a chest x-ray.  Please have patient come in on Monday to get this done

## 2019-01-02 NOTE — Telephone Encounter (Signed)
Pt scheduled for labs 01/03/2019.

## 2019-01-02 NOTE — Telephone Encounter (Signed)
LM for patient to return call.

## 2019-01-03 ENCOUNTER — Ambulatory Visit (INDEPENDENT_AMBULATORY_CARE_PROVIDER_SITE_OTHER): Payer: 59

## 2019-01-03 ENCOUNTER — Other Ambulatory Visit: Payer: 59

## 2019-01-03 DIAGNOSIS — R05 Cough: Secondary | ICD-10-CM

## 2019-01-03 DIAGNOSIS — R0989 Other specified symptoms and signs involving the circulatory and respiratory systems: Secondary | ICD-10-CM | POA: Diagnosis not present

## 2019-01-03 DIAGNOSIS — R059 Cough, unspecified: Secondary | ICD-10-CM

## 2019-01-05 ENCOUNTER — Ambulatory Visit: Payer: Self-pay | Admitting: *Deleted

## 2019-01-05 MED ORDER — HYDROCODONE-HOMATROPINE 5-1.5 MG/5ML PO SYRP: 5.0000 mL | ORAL_SOLUTION | Freq: Four times a day (QID) | ORAL | 0 refills | Status: DC | PRN

## 2019-01-05 NOTE — Telephone Encounter (Signed)
I sent hycodan in for him- if cough lasts another 2 weeks then have him come back to see [place]. No driving for 8 hours after taking medication. D/c his prednisone as he has completed it

## 2019-01-05 NOTE — Telephone Encounter (Signed)
See note

## 2019-01-05 NOTE — Telephone Encounter (Signed)
Summary: cough    Pt calling back stating that he still is having a severe cough that's been around for 3 weeks he states that in the past He had taken HYDROcodone-homatropine (HYCODAN) 5-1.5 MG/5ML syrup And that it has helped go away in the past  ----- Message from Sheran Luz sent at 12/30/2018 11:02 AM EST ----- Patient called with complaints of lingering cough after office visit on 2/7. Patient is requesting a call back from nurse.

## 2019-01-05 NOTE — Telephone Encounter (Signed)
Message from Yvette Rack sent at 01/05/2019 11:57 AM EST     Pt calling back stating that he still is having a severe cough that's been around for 3 weeks he states that in the past He had taken HYDROcodone-homatropine (HYCODAN) 5-1.5 MG/5ML syrup And that it has helped go away in the past     Pt calling to request prescription for Hycodan for a cough that has been around for 3 weeks. Pt states he has taken this medication in the past and it has helped cough go away. Also see nurse triage encounter from 12/30/18.

## 2019-01-05 NOTE — Telephone Encounter (Signed)
Please advise.  Patient seen on 12/23/2018 for post viral cough syndrome.  Would you like him to come back in?

## 2019-01-05 NOTE — Addendum Note (Signed)
Addended by: [name] on: 01/05/2019 08:07 PM   Modules accepted: Orders

## 2019-01-06 NOTE — Telephone Encounter (Signed)
Left message to return phone call. Crm created!

## 2019-01-16 ENCOUNTER — Encounter: Payer: Self-pay | Admitting: Family Medicine

## 2019-01-17 NOTE — Telephone Encounter (Signed)
Please advise if okay to send referral to Ortho?

## 2019-01-24 ENCOUNTER — Ambulatory Visit: Payer: 59 | Admitting: Family Medicine

## 2019-01-24 ENCOUNTER — Encounter: Payer: Self-pay | Admitting: Family Medicine

## 2019-01-24 ENCOUNTER — Ambulatory Visit: Payer: Self-pay

## 2019-01-24 VITALS — BP 100/80 | HR 67 | Temp 97.7°F | Ht 69.0 in | Wt 223.8 lb

## 2019-01-24 DIAGNOSIS — M545 Low back pain, unspecified: Secondary | ICD-10-CM

## 2019-01-24 DIAGNOSIS — R05 Cough: Secondary | ICD-10-CM

## 2019-01-24 DIAGNOSIS — R053 Chronic cough: Secondary | ICD-10-CM

## 2019-01-24 MED ORDER — OMEPRAZOLE 40 MG PO CPDR: 40.0000 mg | DELAYED_RELEASE_CAPSULE | Freq: Every day | ORAL | 1 refills | Status: DC

## 2019-01-24 NOTE — Telephone Encounter (Deleted)
  Reason for Disposition . [1] Follow-up call from patient regarding patient's clinical status AND [2] information NON-URGENT  Answer Assessment - Initial Assessment Questions 1. REASON FOR CALL or QUESTION: "What is your reason for calling today?" or "How can I best help you?" or "What question do you have that I can help answer?"     Message from Jodie Echevaria sent at 01/24/2019 10:52 AM EDT   Patient called to say that since seeing Dr Yong Channel he is still coughing but when he takes Vicks vapor drops it suppresses the cough a little. He stated that in the past the doctor prescribed him the Albuterol inhaler and it did help so he was wondering if that may be a route that Dr Yong Channel may want to go. Patient request a call back with what the next step will be in helping him get rid of this cough. Ph# [phone]  Protocols used: PCP CALL - NO TRIAGE-A-AH, INFORMATION ONLY CALL-A-AH

## 2019-01-24 NOTE — Telephone Encounter (Signed)
Patient called and says the cough he had when he saw Dr. Yong Channel last month is still there. He says the Hycodan cough syrup was only taken for about 4 days, because he didn't like the way it made him feel. He says he's not running a fever, not coughing up any phlegm. I advised based on the encounter note on 01/05/19, Dr. Yong Channel says the patient is to come in for OV if the cough persists for 2 weeks, patient agrees. I advised no availability with Dr. Yong Channel until April, so I will send to the office for an appointment and someone will call with the information, he verbalized understanding.  Message from Jodie Echevaria sent at 01/24/2019 10:52 AM EDT   Patient called to say that since seeing Dr Yong Channel he is still coughing but when he takes Vicks vapor drops it suppresses the cough a little. He stated that in the past the doctor prescribed him the Albuterol inhaler and it did help so he was wondering if that may be a route that Dr Yong Channel may want to go. Patient request a call back with what the next step will be in helping him get rid of this cough. Ph# [phone]   Reason for Disposition . [1] Follow-up call from patient regarding patient's clinical status AND [2] information NON-URGENT

## 2019-01-24 NOTE — Telephone Encounter (Signed)
Spoke to pt told him Dr. Yong Channel has an opening today at 1:40 PM if you can come in. Pt said yes. Appt scheduled for 1:40 PM with Dr. Yong Channel. Pt verbalized understanding.

## 2019-01-24 NOTE — Patient Instructions (Addendum)
Please schedule a visit with our excellent sports medicine physician Dr. Paulla Fore before you leave at the check out desk so he can further evaluate your low back pain  Lets start omeprazole 70m once a day before breakfast. Lets do this for at least a month. If you are not making any improvement by 2-3 weeks please let me know and I will go another direction and refer to pulmonary.   Acid reflux is a very common cause of ongoing cough- and many times it is "silent" where you don't get the classic burning the chest sensation Food Choices for Gastroesophageal Reflux Disease, Adult When you have gastroesophageal reflux disease (GERD), the foods you eat and your eating habits are very important. Choosing the right foods can help ease your discomfort. Think about working with a nutrition specialist (dietitian) to help you make good choices. What are tips for following this plan?  Meals  Choose healthy foods that are low in fat, such as fruits, vegetables, whole grains, low-fat dairy products, and lean meat, fish, and poultry.  Eat small meals often instead of 3 large meals a day. Eat your meals slowly, and in a place where you are relaxed. Avoid bending over or lying down until 2-3 hours after eating.  Avoid eating meals 2-3 hours before bed.  Avoid drinking a lot of liquid with meals.  Cook foods using methods other than frying. Bake, grill, or broil food instead.  Avoid or limit: ? Chocolate. ? Peppermint or spearmint. ? Alcohol. ? Pepper. ? Black and decaffeinated coffee. ? Black and decaffeinated tea. ? Bubbly (carbonated) soft drinks. ? Caffeinated energy drinks and soft drinks.  Limit high-fat foods such as: ? Fatty meat or fried foods. ? Whole milk, cream, butter, or ice cream. ? Nuts and nut butters. ? Pastries, donuts, and sweets made with butter or shortening.  Avoid foods that cause symptoms. These foods may be different for everyone. Common foods that cause symptoms  include: ? Tomatoes. ? Oranges, lemons, and limes. ? Peppers. ? Spicy food. ? Onions and garlic. ? Vinegar. Lifestyle  Maintain a healthy weight. Ask your doctor what weight is healthy for you. If you need to lose weight, work with your doctor to do so safely.  Exercise for at least 30 minutes for 5 or more days each week, or as told by your doctor.  Wear loose-fitting clothes.  Do not smoke. If you need help quitting, ask your doctor.  Sleep with the head of your bed higher than your feet. Use a wedge under the mattress or blocks under the bed frame to raise the head of the bed. Summary  When you have gastroesophageal reflux disease (GERD), food and lifestyle choices are very important in easing your symptoms.  Eat small meals often instead of 3 large meals a day. Eat your meals slowly, and in a place where you are relaxed.  Limit high-fat foods such as fatty meat or fried foods.  Avoid bending over or lying down until 2-3 hours after eating.  Avoid peppermint and spearmint, caffeine, alcohol, and chocolate. This information is not intended to replace advice given to you by your health care provider. Make sure you discuss any questions you have with your health care provider. Document Released: 05/03/2012 Document Revised: 12/08/2016 Document Reviewed: 12/08/2016 Elsevier Interactive Patient Education  2019 [employer]

## 2019-01-24 NOTE — Progress Notes (Signed)
Phone [phone]   Subjective:  Tanner [name] is a 48 y.o. year old very pleasant male patient who presents for/with See problem oriented charting ROS-no fever chills.  No chest pain.  No shortness of breath.  No abnormal fatigue  Past Medical History-  Patient Active Problem List   Diagnosis Date Noted  . Hypertriglyceridemia 10/06/2011    Priority: Medium  . Erectile dysfunction 07/31/2014    Priority: Low  . Allergic rhinitis 08/29/2007    Priority: Low  . GERD 08/29/2007    Priority: Low  . CYST, PILONIDAL W/ABSCESS 08/29/2007    Priority: Low    Medications- reviewed and updated Current Outpatient Medications  Medication Sig Dispense Refill  . Multiple Vitamin (MULTIVITAMIN) tablet Take 1 tablet by mouth daily. Reported on 02/11/2016    . tadalafil (ADCIRCA/CIALIS) 20 MG tablet Take 1 tablet (20 mg total) by mouth daily as needed for erectile dysfunction. 5 tablet 11  . omeprazole (PRILOSEC) 40 MG capsule Take 1 capsule (40 mg total) by mouth daily. 30 capsule 1   No current facility-administered medications for this visit.      Objective:  BP 100/80 (BP Location: Left Arm, Patient Position: Sitting, Cuff Size: Large)   Pulse 67   Temp 97.7 F (36.5 C) (Oral)   Ht 5' 9"  (1.753 m)   Wt 223 lb 12.8 oz (101.5 kg)   SpO2 97%   BMI 33.05 kg/m  Gen: NAD, resting comfortably, intermittent deep cough-wears mask Nasal turbinates and oropharynx normal CV: RRR no murmurs rubs or gallops Lungs: CTAB no crackles, wheeze, rhonchi Ext: no edema Skin: warm, dry Neuro: Speech and gait normal    Assessment and Plan    Chronic cough S: symptoms for 6 weeks now. Treated for bronchitis in early February with prednisone for potential helpw ith inflammation but symptoms did not clear.   We did a follow-up chest x-ray on February 18 due to continued cough-which showed no acute cardiopulmonary disease.  He was given some Hycodan cough medicine but this did not make him feel  well so he is only taken a few doses- but he also had nose bleeds on this which concerned him.   Patient's primary symptom is cough. Vicks vapo lozenges are somewhat helpful. Most of the time when he exhales a certain way- states almost always coughts- feels like a catching sensation.   If he eats something- particularly chocolate he will throw it back up. Certain foods or even cold water can make him get coughing spell and then throw up. Warm drinks are better. Also worse after fast food (though he is taking a break for lent)   Emesis is post tussive after coughing fits   NEVER smoker  He does have allergies- rare sneezing. No watery/itchy eyes. Doesn't feel poorly. Doesn't have fever.   Denies travel or any known contacts with Covid-19.  A/P: Chronic cough- may be lingering from bronchitis but with food related issues (such as much worse after chocolate)- I am concerned about GERD as the cause. Will treat with omeprazole with 2-3 week check in.   Patient Instructions  "Please schedule a visit with our excellent sports medicine physician Dr. Paulla [name] before you leave at the check out desk so he can further evaluate your low back pain  Lets start omeprazole 41m once a day before breakfast. Lets do this for at least a month. If you are not making any improvement by 2-3 weeks please let me know and I will go another  direction and refer to pulmonary.   Acid reflux is a very common cause of ongoing cough- and many times it is "silent" where you don't get the classic burning the chest sensation "  Also referring patient to Dr. Paulla [name] (see mychart message) for chronic issus with low back pain   Future Appointments  Date Time Provider [place]  01/31/2019 11:40 AM Tanner Diss, DO [facility]  05/08/2019  8:40 AM Tanner Olp, MD [facility]   Lab/Order associations: Chronic cough  Low back pain, unspecified back pain laterality, unspecified chronicity, unspecified whether  sciatica present - Plan: Ambulatory referral to [place] ordered this encounter  Medications  . omeprazole (PRILOSEC) 40 MG capsule    Sig: Take 1 capsule (40 mg total) by mouth daily.    Dispense:  30 capsule    Refill:  1   Return precautions advised.  Tanner Reddish, MD

## 2019-01-31 ENCOUNTER — Ambulatory Visit: Payer: 59 | Admitting: Sports Medicine

## 2019-03-07 ENCOUNTER — Encounter: Payer: Self-pay | Admitting: Family Medicine

## 2019-03-08 ENCOUNTER — Ambulatory Visit: Payer: 59 | Admitting: Family Medicine

## 2019-03-16 ENCOUNTER — Other Ambulatory Visit: Payer: Self-pay | Admitting: Family Medicine

## 2019-05-08 ENCOUNTER — Ambulatory Visit (INDEPENDENT_AMBULATORY_CARE_PROVIDER_SITE_OTHER): Payer: 59 | Admitting: Family Medicine

## 2019-05-08 ENCOUNTER — Other Ambulatory Visit: Payer: Self-pay

## 2019-05-08 ENCOUNTER — Encounter: Payer: Self-pay | Admitting: Family Medicine

## 2019-05-08 ENCOUNTER — Ambulatory Visit (INDEPENDENT_AMBULATORY_CARE_PROVIDER_SITE_OTHER): Payer: 59

## 2019-05-08 VITALS — BP 106/72 | HR 72 | Temp 98.5°F | Resp 16 | Ht 69.0 in | Wt 219.8 lb

## 2019-05-08 DIAGNOSIS — Z0000 Encounter for general adult medical examination without abnormal findings: Secondary | ICD-10-CM

## 2019-05-08 DIAGNOSIS — K219 Gastro-esophageal reflux disease without esophagitis: Secondary | ICD-10-CM

## 2019-05-08 DIAGNOSIS — E781 Pure hyperglyceridemia: Secondary | ICD-10-CM | POA: Diagnosis not present

## 2019-05-08 DIAGNOSIS — E669 Obesity, unspecified: Secondary | ICD-10-CM

## 2019-05-08 DIAGNOSIS — Z1211 Encounter for screening for malignant neoplasm of colon: Secondary | ICD-10-CM

## 2019-05-08 DIAGNOSIS — M545 Low back pain, unspecified: Secondary | ICD-10-CM

## 2019-05-08 LAB — POC URINALSYSI DIPSTICK (AUTOMATED)
Bilirubin, UA: NEGATIVE
Blood, UA: NEGATIVE
Glucose, UA: NEGATIVE
Ketones, UA: NEGATIVE
Leukocytes, UA: NEGATIVE
Nitrite, UA: NEGATIVE
Protein, UA: NEGATIVE
Spec Grav, UA: 1.01 (ref 1.010–1.025)
Urobilinogen, UA: 0.2 E.U./dL
pH, UA: 6.5 (ref 5.0–8.0)

## 2019-05-08 LAB — COMPREHENSIVE METABOLIC PANEL
ALT: 19 U/L (ref 0–53)
AST: 21 U/L (ref 0–37)
Albumin: 4.1 g/dL (ref 3.5–5.2)
Alkaline Phosphatase: 109 U/L (ref 39–117)
BUN: 12 mg/dL (ref 6–23)
CO2: 25 mEq/L (ref 19–32)
Calcium: 8.5 mg/dL (ref 8.4–10.5)
Chloride: 107 mEq/L (ref 96–112)
Creatinine, Ser: 0.95 mg/dL (ref 0.40–1.50)
GFR: 85.08 mL/min (ref >60.00)
Glucose, Bld: 85 mg/dL (ref 70–99)
Potassium: 4.3 mEq/L (ref 3.5–5.1)
Sodium: 138 mEq/L (ref 135–145)
Total Bilirubin: 0.6 mg/dL (ref 0.2–1.2)
Total Protein: 6.3 g/dL (ref 6.0–8.3)

## 2019-05-08 LAB — LIPID PANEL
Cholesterol: 157 mg/dL (ref 0–200)
HDL: 37.9 mg/dL — ABNORMAL LOW (ref >39.00)
LDL Cholesterol: 93 mg/dL (ref 0–99)
NonHDL: 118.72
Total CHOL/HDL Ratio: 4
Triglycerides: 131 mg/dL (ref 0.0–149.0)
VLDL: 26.2 mg/dL (ref 0.0–40.0)

## 2019-05-08 LAB — CBC
HCT: 47.1 % (ref 39.0–52.0)
Hemoglobin: 15.8 g/dL (ref 13.0–17.0)
MCHC: 33.6 g/dL (ref 30.0–36.0)
MCV: 90 fl (ref 78.0–100.0)
Platelets: 191 10*3/uL (ref 150.0–400.0)
RBC: 5.23 Mil/uL (ref 4.22–5.81)
RDW: 13.6 % (ref 11.5–15.5)
WBC: 5.1 10*3/uL (ref 4.0–10.5)

## 2019-05-08 NOTE — Patient Instructions (Addendum)
We will call you within two weeks about your referral to Dr. Tamala Julian. If you do not hear within 3 weeks, give [place] a call.   Please stop by lab and x-ray before you go. Make sure to pick up stool cards If you do not have mychart- we will call you about results within 5 business days of [place] receiving them.  If you have mychart- we will send your results within 3 business days of [place] receiving them.  If abnormal or we want to clarify a result, we will call or mychart you to make sure you receive the message.  If you have questions or concerns or don't hear within 5-7 days, please send [place] a message or call us.

## 2019-05-08 NOTE — Progress Notes (Signed)
Phone: [phone]    Subjective:  Patient presents today for their annual physical. Chief complaint-noted.   See problem oriented charting- ROS- full  review of systems was completed and negative except for: seasonal allergies with lingering chronic cough, back pain, some anxiety and stress with work  The following were reviewed and entered/updated in epic: Past Medical History:  Diagnosis Date  . ALLERGIC RHINITIS 08/29/2007  . BACK PAIN 01/08/2009  . Cough 01/27/2008  . Dysuria 01/19/2011  . GASTROENTERITIS, ACUTE 01/08/2009  . GERD 08/29/2007  . HEARING LOSS 05/10/2008  . 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 Shingles outbreak 06/24/2011  . SKIN LESION 10/28/2010  . Wheezing 10/22/2009   Patient Active Problem List   Diagnosis Date Noted  . Hypertriglyceridemia 10/06/2011    Priority: Medium  . Erectile dysfunction 07/31/2014    Priority: Low  . Allergic rhinitis 08/29/2007    Priority: Low  . GERD 08/29/2007    Priority: Low  . CYST, PILONIDAL W/ABSCESS 08/29/2007    Priority: Low   Past Surgical History:  Procedure Laterality Date  . pilonidal cystectomy      Family History  Problem Relation Age of Onset  . Depression Other   . Coronary artery disease Father        18 at diagnosis. 69 at death- MI complications. father was smoker  . Valvular heart disease Father        rheumatic fever- replaced  . Lung cancer Mother        52    Medications- reviewed and updated Current Outpatient Medications  Medication Sig Dispense Refill  . Multiple Vitamin (MULTIVITAMIN) tablet Take 1 tablet by mouth daily. Reported on 02/11/2016    . tadalafil (ADCIRCA/CIALIS) 20 MG tablet Take 1 tablet (20 mg total) by mouth daily as needed for erectile dysfunction. 5 tablet 11   No current facility-administered medications for this visit.     Allergies-reviewed and updated No Known Allergies  Social History   Social History Narrative   Married 2008 with 4 kids (8/6/3/7 months  in 2017). In 2019- wife pregnant with 5th child 2019.    8,6,3, 10 months. G, B, G, G.       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 Works with Health and safety inspector: time with kids at parks, playing in yard      Objective:  BP 106/72   Pulse 72   Temp 98.5 F (36.9 C) (Oral)   Resp 16   Ht 5' 9"  (1.753 m)   Wt 219 lb 12.8 oz (99.7 kg)   SpO2 95%   BMI 32.46 kg/m  Gen: NAD, resting comfortably HEENT: Mucous membranes are moist. Oropharynx normal Neck: no thyromegaly CV: RRR no murmurs rubs or gallops Lungs: CTAB no crackles, wheeze, rhonchi Abdomen: soft/nontender/nondistended/normal bowel sounds. No rebound or guarding.  Ext: no edema Skin: warm, dry Neuro: grossly normal, moves all extremities, PERRLA     Assessment and Plan:  48 y.o. male presenting for annual physical.  Health Maintenance counseling: 1. Anticipatory guidance: Patient counseled regarding regular dental exams -q6 months, eye exams - yearly for contacts,  avoiding smoking and second hand smoke , limiting alcohol to 2 beverages per day.   2. Risk factor reduction:  Advised patient of need for regular exercise and diet rich and fruits and vegetables to reduce risk of heart attack and stroke. Exercise- doing more physical work at house and  that is helping him,  He misses strength training at the gym. Diet-not going out for lunch and that is helping.  Down 4 lbs from last cpe ! Feels like hes going in right direction Wt Readings from Last 3 Encounters:  05/08/19 219 lb 12.8 oz (99.7 kg)  01/24/19 223 lb 12.8 oz (101.5 kg)  12/23/18 225 lb 9.6 oz (102.3 kg)  3. Immunizations/screenings/ancillary studies - up to date Immunization History  Administered Date(s) Administered  . Influenza Split 09/20/2012  . Influenza,inj,Quad PF,6+ Mos 08/27/2017  . Influenza-Unspecified 09/16/2011  . Td 08/16/2009  4. Prostate cancer screening-  no family  history, start at age 13. rctal exam for rectal bleeding last year was low r Lab Results  Component Value Date   PSA 0.83 08/23/2007   5. Colon cancer screening - no family history, start at age 61. Stool cards meant to do these last year but does not appear ordered- he thinks its harsh toilet paper at work causing surface damage- no issues since being at home. 6. Skin cancer screening/prevention- saw dermatology 2019. Not sure if he has follow up. advised regular sunscreen use. Denies worrisome, changing, or new skin lesions.  7. Testicular cancer screening- advised monthly self exams  8. STD screening- patient opts  out as monogamous 9. never smoker  Status of chronic or acute concerns   Low back pain- have encouraged core strengthening in past. At least 2-3 Years of issues. Encouraged standing desk in past. Flares up when kids climbing on his lower back- was getting sharp pains with one episode and felt like couldn't get off floor for 10 mins. Has happened twice now- has stopped getting on the floor with his kids. No issues with bending over/lifting. No radicular symptoms.  - he would like to make sure skeletal issues at least still stable- will update x-ray.  -will refer to. Dr. Tamala Julian as well for [facility] evaluation.   Cough back in February/march. thoguth could be GERD- tried omeprazole and that seemed to help- still has mild intermittent cough if hes outside- likely seasonal allergies. Offered covid 19 testing but will hold off.   Still dealing with right foot plantar fasciitis- can also chat with Dr. Tamala Julian about that.   #social update- alternates weeks at work until august 17th. Not great relationship with current supervisor- may be looking for new job- working with a recruiter right now. Had been noting some stress sweat working with current boss and feeling like didn't want to do this long term. Feels like having to work harder with covid 19 as well and that has been hard. Has had some moments  of anxiety- dreads going to work.   Lab/Order associations: had coffee with half and half this AM   ICD-10-CM   1. Preventative health care  Z00.00 CBC    Comprehensive metabolic panel    Lipid panel    POCT Urinalysis Dipstick (Automated)  2. Hypertriglyceridemia  E78.1 CBC    Comprehensive metabolic panel    Lipid panel    POCT Urinalysis Dipstick (Automated)  3. Gastroesophageal reflux disease without esophagitis  K21.9   4. Screen for colon cancer  Z12.11 Fecal occult blood, imunochemical    Fecal occult blood, imunochemical  5. Bilateral low back pain without sciatica, unspecified chronicity  M54.5 DG Lumbar Spine Complete    Ambulatory referral to Sports Medicine  6. Obesity (BMI 30-39.9)  E66.9    Return precautions advised.   [name], MD

## 2019-05-08 NOTE — Addendum Note (Signed)
Addended by: Francis Dowse T on: 05/08/2019 09:57 AM   Modules accepted: Orders

## 2019-05-09 ENCOUNTER — Encounter: Payer: 59 | Admitting: Family Medicine

## 2019-05-09 ENCOUNTER — Telehealth: Payer: Self-pay | Admitting: Family Medicine

## 2019-05-09 NOTE — Telephone Encounter (Signed)
Spoke to pt and reviewed both lab and L-spine XR results.

## 2019-05-09 NOTE — Telephone Encounter (Signed)
Patient called and is returning Dr. Ronney Lion CMA phone call. Please call patient back to talk about his lab results.

## 2019-05-23 NOTE — Progress Notes (Signed)
[facility] [address], [place] [address] Phone: [phone] Subjective:   Fontaine No, am serving as a scribe for Dr. Hulan Saas.  I'm seeing this patient by the request  of:  [name], MD   CC: Low back pain  [id number]  Tanner [name] is a 48 y.o. male coming in with complaint of back pain. Xray on 05/08/2019: Osteoarthritic change at several levels as well as moderate narrowing of each hip joint. No fracture or spondylolisthesis.  Patient states his pain started a couple years ago. Kids were jumping on his back a couple months ago and he was not able to get up from the floor due to the pain that developed. Two weeks ago his child jumped on his back again and felt the same pain in the lumbar spine. Pain is centrally located. Denies any radiating symptoms. Patient has been using heat and ice for pain. Lying on his back increases his pain.     back xray  spondylolisthesis. There is moderate disc space narrowing at L3-4 and L4-5. Other disc spaces appear unremarkable. Foci of calcification are noted anteriorly at L3 and L4. There is facet osteoarthritic change at L4-5 and L5-S1 bilaterally. There is degenerative change in each hip joint.  Past Medical History:  Diagnosis Date  . ALLERGIC RHINITIS 08/29/2007  . BACK PAIN 01/08/2009  . Cough 01/27/2008  . Dysuria 01/19/2011  . GASTROENTERITIS, ACUTE 01/08/2009  . GERD 08/29/2007  . HEARING LOSS 05/10/2008  . 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 Shingles outbreak 06/24/2011  . SKIN LESION 10/28/2010  . Wheezing 10/22/2009   Past Surgical History:  Procedure Laterality Date  . pilonidal cystectomy     Social History   Socioeconomic History  . Marital status: Married    Spouse name: Not on file  . Number of children: 1  . Years of education: Not on file  . Highest education level: Not on file  Occupational History  . Occupation: Careers information officer    Comment: [employer]  Social Needs  . Financial resource strain: Not on file  . Food insecurity    Worry: Not on file    Inability: Not on file  . Transportation needs    Medical: Not on file    Non-medical: Not on file  Tobacco Use  . Smoking status: Never Smoker  . Smokeless tobacco: Never Used  Substance and Sexual Activity  . Alcohol use: Yes    Alcohol/week: 6.0 standard drinks    Types: 6 Standard drinks or equivalent per week  . Drug use: No  . Sexual activity: Not on file  Lifestyle  . Physical activity    Days per week: Not on file    Minutes per session: Not on file  . Stress: Not on file  Relationships  . Social Herbalist on phone: Not on file    Gets together: Not on file    Attends religious service: Not on file    Active member of club or organization: Not on file    Attends meetings of clubs or organizations: Not on file    Relationship status: Not on file  Other Topics Concern  . Not on file  Social History Narrative   Married 2008 with 4 kids (8/6/3/7 months in 2017). In 2019- wife pregnant with 5th child 2019.    8,6,3, 10 months. G, B, G, G.       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 Depression Other   . Coronary artery disease Father        62 at diagnosis. 15 at death- MI complications. father was smoker  . Valvular heart disease Father        rheumatic fever- replaced  . Lung cancer Mother        20     Current Outpatient Medications (Cardiovascular):  .  tadalafil (ADCIRCA/CIALIS) 20 MG tablet, Take 1 tablet (20 mg total) by mouth daily as needed for erectile dysfunction.     Current Outpatient Medications (Other):  Marland Kitchen  Multiple Vitamin (MULTIVITAMIN) tablet, Take 1 tablet by mouth daily. Reported on 02/11/2016 .   gabapentin (NEURONTIN) 100 MG capsule, Take 2 capsules (200 mg total) by mouth at bedtime.    Past medical history, social, surgical and family history all reviewed in electronic medical record.  No pertanent information unless stated regarding to the chief complaint.   Review of Systems:  No headache, visual changes, nausea, vomiting, diarrhea, constipation, dizziness, abdominal pain, skin rash, fevers, chills, night sweats, weight loss, swollen lymph nodes, body aches, joint swelling, muscle aches, chest pain, shortness of breath, mood changes.   Objective  Blood pressure 110/62, pulse 73, height 5' 9"  (1.753 m), weight 219 lb (99.3 kg), SpO2 98 %.    General: No apparent distress alert and oriented x3 mood and affect normal, dressed appropriately.  HEENT: Pupils equal, extraocular movements intact  Respiratory: Patient's speak in full sentences and does not appear short of breath  Cardiovascular: No lower extremity edema, non tender, no erythema  Skin: Warm dry intact with no signs of infection or rash on extremities or on axial skeleton.  Abdomen: Soft nontender  Neuro: Cranial nerves II through XII are intact, neurovascularly intact in all extremities with 2+ DTRs and 2+ pulses.  Lymph: No lymphadenopathy of posterior or anterior cervical chain or axillae bilaterally.  Gait normal with good balance and coordination.  MSK:  Non tender with full range of motion and good stability and symmetric strength and tone of shoulders, elbows, wrist, hip, knee and ankles bilaterally.  Low back exam does show significant loss of lordosis.  Patient does have some limited extension with 10 degrees.  Positive stork test on the right side.  Patient has negative straight leg test but significant tightness of the hamstrings bilaterally.  Tightness of the [facility] test bilaterally.  No radicular symptoms.  4+ out of 5 strength in lower extremities bilaterally.  Deep tendon reflexes intact  [id number]; 15 additional  minutes spent for Therapeutic exercises as stated in above notes.  This included exercises focusing on stretching, strengthening, with significant focus on eccentric aspects.   Long term goals include an improvement in range of motion, strength, endurance as well as avoiding reinjury. Patient's frequency would include in 1-2 times a day, 3-5 times a week for a duration of 6-12 weeks. Low back exercises that included:  Pelvic tilt/bracing instruction to focus on control of the pelvic girdle and lower abdominal muscles  Glute strengthening exercises, focusing on proper firing of the glutes without engaging the low back muscles Proper stretching techniques for maximum relief for the hamstrings, hip flexors, low back and some rotation where tolerated    Proper technique  shown and discussed handout in great detail with ATC.  All questions were discussed and answered.      Impression and Recommendations:     This case required medical decision making of moderate complexity. The above documentation has been reviewed and is accurate and complete Tanner Pulley, DO       Note: This dictation was prepared with Dragon dictation along with smaller phrase technology. Any transcriptional errors that result from this process are unintentional.

## 2019-05-24 ENCOUNTER — Other Ambulatory Visit: Payer: Self-pay

## 2019-05-24 ENCOUNTER — Ambulatory Visit: Payer: 59 | Admitting: Family Medicine

## 2019-05-24 ENCOUNTER — Encounter: Payer: Self-pay | Admitting: Family Medicine

## 2019-05-24 DIAGNOSIS — M51369 Other intervertebral disc degeneration, lumbar region without mention of lumbar back pain or lower extremity pain: Secondary | ICD-10-CM | POA: Insufficient documentation

## 2019-05-24 DIAGNOSIS — M5136 Other intervertebral disc degeneration, lumbar region: Secondary | ICD-10-CM

## 2019-05-24 MED ORDER — GABAPENTIN 100 MG PO CAPS: 200.0000 mg | ORAL_CAPSULE | Freq: Every day | ORAL | 0 refills | Status: DC

## 2019-05-24 NOTE — Assessment & Plan Note (Signed)
Spondylolisthesis.  I do believe that there is some loss of lordosis.  Some mild radicular symptoms intermittently.  Gabapentin given for nighttime relief, icing regimen, exercise, core strength and stability, discussed over-the-counter medications.  Follow-up again in 5 to 6 weeks.  Future considerations will include formal physical therapy or osteopathic manipulation

## 2019-05-24 NOTE — Patient Instructions (Addendum)
Good to see you.  Ice 20 minutes 2 times daily. Usually after activity and before bed. Exercises 3 times a week.  Gabapentin 200 mg at night  Turmeric 523m daily  Tart cherry extract 12052mat night Vitamin D 2000 IU daily  See me again in 5-6 weeks

## 2019-05-26 ENCOUNTER — Other Ambulatory Visit (INDEPENDENT_AMBULATORY_CARE_PROVIDER_SITE_OTHER): Payer: 59

## 2019-05-26 DIAGNOSIS — Z0000 Encounter for general adult medical examination without abnormal findings: Secondary | ICD-10-CM

## 2019-05-26 LAB — FECAL OCCULT BLOOD, IMMUNOCHEMICAL: Fecal Occult Bld: NEGATIVE

## 2019-05-26 NOTE — Addendum Note (Signed)
Addended by: Francis Dowse T on: 05/26/2019 02:36 PM   Modules accepted: Orders

## 2019-05-26 NOTE — Addendum Note (Signed)
Addended by: Francis Dowse T on: 05/26/2019 02:24 PM   Modules accepted: Orders

## 2019-07-03 NOTE — Progress Notes (Signed)
[facility] [address], [place] [address] Phone: [phone] Subjective:   Fontaine No, am serving as a scribe for Dr. Hulan Saas.  I'm seeing this patient by the request  of:    CC: Low back pain follow-up  [id number]   05/24/2019 Spondylolisthesis.  I do believe that there is some loss of lordosis.  Some mild radicular symptoms intermittently.  Gabapentin given for nighttime relief, icing regimen, exercise, core strength and stability, discussed over-the-counter medications.  Follow-up again in 5 to 6 weeks.  Future considerations will include formal physical therapy or osteopathic manipulation  Update 07/04/2019 Tanner [name] is a 48 y.o. male coming in with complaint of back pain. Has not had any spasms since last visit. Has not had any catching. States that he is using supplements. Is happy with progress.  Patient states he is feeling 95% better.  Still very aware of some of the pain but nothing severe though at this moment.  Patient has not noticed any radicular symptoms.  Gabapentin helps at night.     Past Medical History:  Diagnosis Date  . ALLERGIC RHINITIS 08/29/2007  . BACK PAIN 01/08/2009  . Cough 01/27/2008  . Dysuria 01/19/2011  . GASTROENTERITIS, ACUTE 01/08/2009  . GERD 08/29/2007  . HEARING LOSS 05/10/2008  . 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 Shingles outbreak 06/24/2011  . SKIN LESION 10/28/2010  . Wheezing 10/22/2009   Past Surgical History:  Procedure Laterality Date  . pilonidal cystectomy     Social History   Socioeconomic History  . Marital status: Married    Spouse name: Not on file  . Number of children: 1  . Years of education: Not on file  . Highest education level: Not on file  Occupational History  . Occupation: Careers information officer    Comment: [employer]  Social Needs  . Financial resource strain: Not on file  . Food insecurity    Worry: Not on file    Inability: Not on file  .  Transportation needs    Medical: Not on file    Non-medical: Not on file  Tobacco Use  . Smoking status: Never Smoker  . Smokeless tobacco: Never Used  Substance and Sexual Activity  . Alcohol use: Yes    Alcohol/week: 6.0 standard drinks    Types: 6 Standard drinks or equivalent per week  . Drug use: No  . Sexual activity: Not on file  Lifestyle  . Physical activity    Days per week: Not on file    Minutes per session: Not on file  . Stress: Not on file  Relationships  . Social Herbalist on phone: Not on file    Gets together: Not on file    Attends religious service: Not on file    Active member of club or organization: Not on file    Attends meetings of clubs or organizations: Not on file    Relationship status: Not on file  Other Topics Concern  . Not on file  Social History Narrative   Married 2008 with 4 kids (8/6/3/7 months in 2017). In 2019- wife pregnant with 5th child 2019.    8,6,3, 10 months. G, B, G, G.       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 Depression Other   . Coronary artery disease Father        43 at diagnosis. 16 at death- MI complications. father was smoker  . Valvular heart disease Father        rheumatic fever- replaced  . Lung cancer Mother        39     Current Outpatient Medications (Cardiovascular):  .  tadalafil (ADCIRCA/CIALIS) 20 MG tablet, Take 1 tablet (20 mg total) by mouth daily as needed for erectile dysfunction.     Current Outpatient Medications (Other):  .  gabapentin (NEURONTIN) 100 MG capsule, Take 2 capsules (200 mg total) by mouth at bedtime. .  Multiple Vitamin (MULTIVITAMIN) tablet, Take 1 tablet by mouth daily. Reported on 02/11/2016    Past medical history, social, surgical and family  history all reviewed in electronic medical record.  No pertanent information unless stated regarding to the chief complaint.   Review of Systems:  No headache, visual changes, nausea, vomiting, diarrhea, constipation, dizziness, abdominal pain, skin rash, fevers, chills, night sweats, weight loss, swollen lymph nodes, body aches, joint swelling, muscle aches, chest pain, shortness of breath, mood changes.   Objective  Blood pressure 110/78, pulse 82, height 5' 9"  (1.753 m), weight 216 lb (98 kg), SpO2 98 %.   General: No apparent distress alert and oriented x3 mood and affect normal, dressed appropriately.  HEENT: Pupils equal, extraocular movements intact  Respiratory: Patient's speak in full sentences and does not appear short of breath  Cardiovascular: No lower extremity edema, non tender, no erythema  Skin: Warm dry intact with no signs of infection or rash on extremities or on axial skeleton.  Abdomen: Soft nontender  Neuro: Cranial nerves II through XII are intact, neurovascularly intact in all extremities with 2+ DTRs and 2+ pulses.  Lymph: No lymphadenopathy of posterior or anterior cervical chain or axillae bilaterally.  Gait normal with good balance and coordination.  MSK:  Non tender with full range of motion and good stability and symmetric strength and tone of shoulders, elbows, wrist, hip, knee and ankles bilaterally.  Back Exam:  Inspection: Mild loss of lordosis Motion: Flexion 45 deg, Extension 25 deg, Side Bending to 35 deg bilaterally,  Rotation to 35 deg bilaterally  SLR laying: Negative  XSLR laying: Negative  Palpable tenderness: Tender to palpation moderate in the paraspinal musculature. FABER: negative. Sensory change: Gross sensation intact to all lumbar and sacral dermatomes.  Reflexes: 2+ at both patellar tendons, 2+ at achilles tendons, Babinski's downgoing.  Strength at foot  Plantar-flexion: 5/5 Dorsi-flexion: 5/5 Eversion: 5/5 Inversion: 5/5  Leg strength   Quad: 5/5 Hamstring: 5/5 Hip flexor: 5/5 Hip abductors: 5/5  Gait unremarkable.   Impression and Recommendations:      The above documentation has been reviewed and is accurate and complete Lyndal Pulley, DO       Note: This dictation was prepared with Dragon dictation along with smaller phrase technology. Any transcriptional errors that result from this process are unintentional.

## 2019-07-04 ENCOUNTER — Other Ambulatory Visit: Payer: Self-pay

## 2019-07-04 ENCOUNTER — Encounter: Payer: Self-pay | Admitting: Family Medicine

## 2019-07-04 ENCOUNTER — Ambulatory Visit: Payer: 59 | Admitting: Family Medicine

## 2019-07-04 DIAGNOSIS — M5136 Other intervertebral disc degeneration, lumbar region: Secondary | ICD-10-CM

## 2019-07-04 NOTE — Assessment & Plan Note (Signed)
Patient is doing much better with conservative therapy at this point.  No significant changes in management.  Patient knows that if any worsening pain consider osteopathic manipulation as well as formal physical therapy.  As long as patient does well follow-up as needed

## 2019-07-04 NOTE — Patient Instructions (Signed)
Keep up the good work Exercise 1-2 times a week Continue the vitamins [phone]

## 2019-09-20 ENCOUNTER — Encounter: Payer: Self-pay | Admitting: Family Medicine

## 2019-09-21 ENCOUNTER — Other Ambulatory Visit: Payer: Self-pay

## 2019-09-21 DIAGNOSIS — Z20822 Contact with and (suspected) exposure to covid-19: Secondary | ICD-10-CM

## 2019-09-23 ENCOUNTER — Encounter: Payer: Self-pay | Admitting: Family Medicine

## 2019-09-23 LAB — NOVEL CORONAVIRUS, NAA: SARS-CoV-2, NAA: NOT DETECTED

## 2019-09-25 ENCOUNTER — Other Ambulatory Visit: Payer: Self-pay

## 2019-09-25 ENCOUNTER — Telehealth: Payer: Self-pay

## 2019-09-25 MED ORDER — TADALAFIL 20 MG PO TABS: 20.0000 mg | ORAL_TABLET | Freq: Every day | ORAL | 11 refills | Status: DC | PRN

## 2019-09-25 NOTE — Telephone Encounter (Signed)
Called and informed patient that test for Covid 19 was NEGATIVE. Discussed signs and symptoms of Covid 19 : fever, chills, respiratory symptoms, cough, ENT symptoms, sore throat, SOB, muscle pain, diarrhea, headache, loss of taste/smell, close exposure to COVID-19 patient. Pt instructed to call PCP if they develop the above signs and sx. Pt also instructed to call 911 if having respiratory issues/distress. Discussed MyChart enrollment. Pt verbalized understanding.

## 2020-02-07 ENCOUNTER — Encounter: Payer: Self-pay | Admitting: Family Medicine

## 2020-03-06 ENCOUNTER — Encounter: Payer: Self-pay | Admitting: Family Medicine

## 2020-03-25 ENCOUNTER — Encounter: Payer: Self-pay | Admitting: Family Medicine

## 2020-03-26 MED ORDER — TADALAFIL 20 MG PO TABS: 20.0000 mg | ORAL_TABLET | Freq: Every day | ORAL | 11 refills | Status: DC | PRN

## 2020-03-29 ENCOUNTER — Encounter: Payer: Self-pay | Admitting: Family Medicine

## 2020-04-01 ENCOUNTER — Encounter: Payer: Self-pay | Admitting: Family Medicine

## 2020-04-12 ENCOUNTER — Other Ambulatory Visit: Payer: Self-pay

## 2020-04-12 ENCOUNTER — Ambulatory Visit: Payer: 59 | Admitting: Family Medicine

## 2020-04-12 ENCOUNTER — Encounter: Payer: Self-pay | Admitting: Family Medicine

## 2020-04-12 VITALS — BP 120/90 | HR 71 | Temp 98.1°F | Ht 69.0 in | Wt 226.0 lb

## 2020-04-12 DIAGNOSIS — B351 Tinea unguium: Secondary | ICD-10-CM | POA: Diagnosis not present

## 2020-04-12 DIAGNOSIS — R079 Chest pain, unspecified: Secondary | ICD-10-CM | POA: Insufficient documentation

## 2020-04-12 DIAGNOSIS — E781 Pure hyperglyceridemia: Secondary | ICD-10-CM | POA: Diagnosis not present

## 2020-04-12 DIAGNOSIS — E785 Hyperlipidemia, unspecified: Secondary | ICD-10-CM | POA: Diagnosis not present

## 2020-04-12 DIAGNOSIS — Z0000 Encounter for general adult medical examination without abnormal findings: Secondary | ICD-10-CM | POA: Diagnosis not present

## 2020-04-12 DIAGNOSIS — Z79899 Other long term (current) drug therapy: Secondary | ICD-10-CM

## 2020-04-12 LAB — COMPREHENSIVE METABOLIC PANEL
ALT: 28 U/L (ref 0–53)
AST: 25 U/L (ref 0–37)
Albumin: 4.4 g/dL (ref 3.5–5.2)
Alkaline Phosphatase: 112 U/L (ref 39–117)
BUN: 17 mg/dL (ref 6–23)
CO2: 29 mEq/L (ref 19–32)
Calcium: 9.2 mg/dL (ref 8.4–10.5)
Chloride: 104 mEq/L (ref 96–112)
Creatinine, Ser: 1.09 mg/dL (ref 0.40–1.50)
GFR: 72.31 mL/min (ref >60.00)
Glucose, Bld: 88 mg/dL (ref 70–99)
Potassium: 4.7 mEq/L (ref 3.5–5.1)
Sodium: 139 mEq/L (ref 135–145)
Total Bilirubin: 0.5 mg/dL (ref 0.2–1.2)
Total Protein: 7 g/dL (ref 6.0–8.3)

## 2020-04-12 LAB — CBC WITH DIFFERENTIAL/PLATELET
Basophils Absolute: 0.1 10*3/uL (ref 0.0–0.1)
Basophils Relative: 1.3 % (ref 0.0–3.0)
Eosinophils Absolute: 0.3 10*3/uL (ref 0.0–0.7)
Eosinophils Relative: 4.6 % (ref 0.0–5.0)
HCT: 45.8 % (ref 39.0–52.0)
Hemoglobin: 15.4 g/dL (ref 13.0–17.0)
Lymphocytes Relative: 37.4 % (ref 12.0–46.0)
Lymphs Abs: 2.1 10*3/uL (ref 0.7–4.0)
MCHC: 33.7 g/dL (ref 30.0–36.0)
MCV: 89.4 fl (ref 78.0–100.0)
Monocytes Absolute: 0.4 10*3/uL (ref 0.1–1.0)
Monocytes Relative: 7.7 % (ref 3.0–12.0)
Neutro Abs: 2.8 10*3/uL (ref 1.4–7.7)
Neutrophils Relative %: 49 % (ref 43.0–77.0)
Platelets: 197 10*3/uL (ref 150.0–400.0)
RBC: 5.12 Mil/uL (ref 4.22–5.81)
RDW: 13.1 % (ref 11.5–15.5)
WBC: 5.7 10*3/uL (ref 4.0–10.5)

## 2020-04-12 LAB — LIPID PANEL
Cholesterol: 155 mg/dL (ref 0–200)
HDL: 36.7 mg/dL — ABNORMAL LOW (ref >39.00)
LDL Cholesterol: 94 mg/dL (ref 0–99)
NonHDL: 118.49
Total CHOL/HDL Ratio: 4
Triglycerides: 122 mg/dL (ref 0.0–149.0)
VLDL: 24.4 mg/dL (ref 0.0–40.0)

## 2020-04-12 MED ORDER — TERBINAFINE HCL 250 MG PO TABS: 250.0000 mg | ORAL_TABLET | Freq: Every day | ORAL | 0 refills | Status: DC

## 2020-04-12 NOTE — Patient Instructions (Addendum)
Health Maintenance Due  Topic Date Due  . Tanner [name] -will get after covid vaccine time frame 08/17/2019   Please stop by lab before you go If you have mychart- we will send your results within 3 business days of [place] receiving them.  If you do not have mychart- we will call you about results within 5 business days of [place] receiving them.     Patient with onychomycosis of bilateral great toenails.  Will treat with Lamisil for 12 weeks.  He will come back for liver function test in 6 weeks.  If no improvement with treatment give our office a call and we will start referral to podiatry for you.   In regards to thickened skin at the base of his heels-recommended pumice stone to get down the excess skin as long as he does not have significant pain or bleeding-I would then use Vaseline twice a day for lubrication-if there is any fungal element to this area should be covered by the Lamisil  If you notice increased chest pain, shortness of breath, new or increased symptoms please give our office a call or got to ED for evaluation. I strongly feel that it is muscular you may want to try using heating pad 2-3 times a day for 20 minutes.   Try to work on healthy snacking options while working from home. A good idea may be to use My fitness pall to help log your calories. Working out at [employer] three times a week will also be very helpful.   We will talk about sending your for colonoscopy at your next physical.    Try to find a way to make you workspace more ergonomic. This will help with back, neck and chest pain.

## 2020-04-12 NOTE — Assessment & Plan Note (Addendum)
#  Onychomycosis S: pt c/o thickening of skin at base of heels that cracks at times when gets dry. also with yellow thickened toenail bilateral big toe for prolonged period. Has not tried anything. Feet get dry and then crack open at times.  Lab Results  Component Value Date   ALT 19 05/08/2019   AST 21 05/08/2019   ALKPHOS 109 05/08/2019   BILITOT 0.6 05/08/2019  A/P: Patient with onychomycosis of bilateral great toenails.  Will treat with Lamisil for 12 weeks.  He will come back for liver function test in 6 weeks.  In regards to thickened skin at the base of his heels-recommended pumice stone to get down the excess skin as long as he does not have significant pain or bleeding-I would then use Vaseline twice a day for lubrication-if there is any fungal element to this area should be covered by the Lamisil  Refer to podiatry if does not improve with treatment
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
## 2020-04-12 NOTE — Progress Notes (Signed)
Phone: [phone]    Subjective:  Patient presents today for their annual physical. Chief complaint-noted.   See problem oriented charting- ROS- full  review of systems was completed and negative  except for: thickened yellow toenails- chest pain- see separate problem oriented charting  The following were reviewed and entered/updated in epic: Past Medical History:  Diagnosis Date  . ALLERGIC RHINITIS 08/29/2007  . BACK PAIN 01/08/2009  . Cough 01/27/2008  . Dysuria 01/19/2011  . GASTROENTERITIS, ACUTE 01/08/2009  . GERD 08/29/2007  . HEARING LOSS 05/10/2008  . 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 Shingles outbreak 06/24/2011  . SKIN LESION 10/28/2010  . Wheezing 10/22/2009   Patient Active Problem List   Diagnosis Date Noted  . Hypertriglyceridemia 10/06/2011    Priority: Medium  . Degenerative disc disease, lumbar 05/24/2019    Priority: Low  . Erectile dysfunction 07/31/2014    Priority: Low  . Allergic rhinitis 08/29/2007    Priority: Low  . GERD 08/29/2007    Priority: Low  . CYST, PILONIDAL W/ABSCESS 08/29/2007    Priority: Low  . Chest pain 04/12/2020  . Onychomycosis 09/14/2008   Past Surgical History:  Procedure Laterality Date  . pilonidal cystectomy      Family History  Problem Relation Age of Onset  . Depression Other   . Coronary artery disease Father        72 at diagnosis. 87 at death- MI complications. father was smoker  . Valvular heart disease Father        rheumatic fever- replaced  . Lung cancer Mother        49    Medications- reviewed and updated Current Outpatient Medications  Medication Sig Dispense Refill  . tadalafil (CIALIS) 20 MG tablet Take 1 tablet (20 mg total) by mouth daily as needed for erectile dysfunction. 5 tablet 11  . terbinafine (LAMISIL) 250 MG tablet Take 1 tablet (250 mg total) by mouth daily. 90 tablet 0   No current facility-administered medications for this visit.    Allergies-reviewed and updated No Known  Allergies  Social History   Social History Narrative   Married 2008 with 4 kids (8/6/3/7 months in 2017). In 2019- wife pregnant with 5th child 2019.    8,6,3, 10 months. G, B, G, G.       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 Works with Health and safety inspector: time with kids at parks, playing in yard      Objective:  BP 120/90   Pulse 71   Temp 98.1 F (36.7 C)   Ht 5' 9"  (1.753 m)   Wt 226 lb (102.5 kg)   BMI 33.37 kg/m  Gen: NAD, resting comfortably HEENT: Mucous membranes are moist. Oropharynx normal Neck: no thyromegaly CV: RRR no murmurs rubs or gallops Lungs: CTAB no crackles, wheeze, rhonchi Abdomen: soft/nontender/nondistended/normal bowel sounds. No rebound or guarding.  Ext: no edema Skin: warm, dry Neuro: grossly normal, moves all extremities, PERRLA  bp to recheck by staff- see separate note    Assessment and Plan:  49 y.o. male presenting for annual physical.  Health Maintenance counseling: 1. Anticipatory guidance: Patient counseled regarding regular dental exams -q6 months, eye exams -  Yearly for contacts,  avoiding smoking and second hand smoke , limiting alcohol to 2 beverages per day- 6-8 per week.   2. Risk factor reduction:  Advised patient of need for regular exercise and diet rich  and fruits and vegetables to reduce risk of heart attack and stroke. Exercise- not currently but just called gym and will reinstate membership- goal 3 days a week. Diet- weight up 10 lbs- working from home has been hard for him- needs to get chips out of house- he is snacking more. myfitnesspal may help  Wt Readings from Last 3 Encounters:  04/12/20 226 lb (102.5 kg)  07/04/19 216 lb (98 kg)  05/24/19 219 lb (99.3 kg)   3. Immunizations/screenings/ancillary studies- up to date Immunization History  Administered Date(s) Administered  . Influenza Split 09/20/2012  . Influenza,inj,Quad PF,6+ Mos  08/27/2017  . Influenza-Unspecified 09/16/2011  . PFIZER SARS-COV-2 Vaccination 03/02/2020, 03/26/2020  . Td 08/16/2009    4. Prostate cancer screening- no family history, start at age 60  Lab Results  Component Value Date   PSA 0.83 08/23/2007   5. Colon cancer screening - prefers to wait until next year to see if insurance is recovering better with new guideline changes-he did have negative fecal occult blood test May 26, 2019 at least. 6. Skin cancer screening/prevention- no dermatologist. advised regular sunscreen use. Denies worrisome, changing, or new skin lesions.  7. Testicular cancer screening- advised monthly self exams  8. STD screening- patient opts out as monogomous 9. Never smoker-   Status of chronic or acute concerns   #Dyslipidemia- low HDL and in past high LDL/hypertriglyceridemia- in past S: Medication: None Lab Results  Component Value Date   CHOL 157 05/08/2019   HDL 37.90 (L) 05/08/2019   LDLCALC 93 05/08/2019   LDLDIRECT 89.1 10/01/2011   TRIG 131.0 05/08/2019   CHOLHDL 4 05/08/2019   A/P: We will update lipid panel today-he is concerned with the pandemic that numbers may have worsened-likely treatment will be lifestyle modification-doubt he will be in range where he needs to consider cholesterol medication.  Using last years labs continue risk of heart attack or stroke is only 2.1% well below the threshold of 7.5% we typically use for medication  #lumbar DDD- no issues lately- consistent with exercises.   Recommended follow up: 1 year physical or sooner if needed  Lab/Order associations: fasting   ICD-10-CM   1. Tinea pedis of both feet  B35.3 CANCELED: Ambulatory referral to Podiatry  2. Preventative health care  Z00.00   3. Onychomycosis  B35.1   4. High risk medication use  Z79.899   5. Chest pain, unspecified type  R07.9   6. Hypertriglyceridemia  E78.1   7. Dyslipidemia  E78.5     Meds ordered this encounter  Medications  . terbinafine  (LAMISIL) 250 MG tablet    Sig: Take 1 tablet (250 mg total) by mouth daily.    Dispense:  90 tablet    Refill:  0    Return precautions advised.   [name], MD
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
## 2020-04-19 ENCOUNTER — Telehealth: Payer: Self-pay | Admitting: Family Medicine

## 2020-04-19 NOTE — Telephone Encounter (Signed)
LVM for patient to call back and schedule a Nurse visit for a EKG and recheck BP -Joellen will do.

## 2020-05-02 ENCOUNTER — Encounter: Payer: Self-pay | Admitting: Family Medicine

## 2020-05-03 ENCOUNTER — Other Ambulatory Visit: Payer: Self-pay | Admitting: Family Medicine

## 2020-05-18 ENCOUNTER — Encounter: Payer: Self-pay | Admitting: Family Medicine

## 2020-05-21 ENCOUNTER — Other Ambulatory Visit: Payer: Self-pay

## 2020-05-24 ENCOUNTER — Other Ambulatory Visit: Payer: 59

## 2020-06-11 ENCOUNTER — Encounter: Payer: Self-pay | Admitting: Family Medicine

## 2020-06-12 NOTE — Telephone Encounter (Signed)
Please call pt and schedule an appt with Dr. Yong Channel. Can use Same Day appt.

## 2020-06-12 NOTE — Telephone Encounter (Signed)
LVM for patient to call back and schedule appt

## 2020-06-18 ENCOUNTER — Other Ambulatory Visit: Payer: Self-pay

## 2020-06-18 ENCOUNTER — Other Ambulatory Visit: Payer: 59

## 2020-06-18 DIAGNOSIS — Z20822 Contact with and (suspected) exposure to covid-19: Secondary | ICD-10-CM

## 2020-06-19 LAB — NOVEL CORONAVIRUS, NAA: SARS-CoV-2, NAA: NOT DETECTED

## 2020-06-19 LAB — SARS-COV-2, NAA 2 DAY TAT

## 2020-06-27 ENCOUNTER — Encounter: Payer: Self-pay | Admitting: Family Medicine

## 2020-06-28 MED ORDER — SILDENAFIL CITRATE 100 MG PO TABS
100.0000 mg | ORAL_TABLET | Freq: Every day | ORAL | 11 refills | Status: DC | PRN
Start: 2020-06-28 — End: 2020-08-19

## 2020-08-18 ENCOUNTER — Encounter: Payer: Self-pay | Admitting: Family Medicine

## 2020-08-19 ENCOUNTER — Other Ambulatory Visit: Payer: Self-pay

## 2020-08-19 MED ORDER — TADALAFIL 20 MG PO TABS
20.0000 mg | ORAL_TABLET | ORAL | 11 refills | Status: DC
Start: 2020-08-19 — End: 2021-05-07
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
## 2021-01-09 NOTE — Patient Instructions (Incomplete)
Health Maintenance Due  Topic Date Due  . Hepatitis C Screening  Never done  . COLONOSCOPY (Pts 45-78yr Insurance coverage will need to be confirmed) Discuss Never done  . COVID-19 Vaccine (3 - Booster for [employer]) will call back with dates.  09/26/2020   Start aspirin 81 mg daily. We considered statin but opted to hold off for now until cardiology sees you  Start omeprazole 415mfor reflux for a month- can go down to 2030mver the counterafter that.   Take it easy on running or basketball- anything with more cardio for now. Stop any activity that begins to cause symptoms. If worsening symptoms despite stopping seek care immediately. Also could try a nitroglycerin which would give us [place] information. Lets stick with walking for now  I sent in nitroglycerin which you can try if you have chest pain- I would still call and seek care if you have to use this. Do not take if have taken cialis within 72 hours- may just make sense to avoid cialis at this point.   We will call you within two weeks about your referral to cardiology. If you do not hear within 2 weeks, give us [place].   Please stop by land and x-ray before you go If you have mychart- we will send your results within 3 business days of us [place]iving them.  If you do not have mychart- we will call you about results within 5 business days of us [place]iving them.   Recommended follow up: Return for as needed for new, worsening, persistent symptoms. lets follow up perhaps a few weeks after cardiology workup completed

## 2021-01-10 ENCOUNTER — Ambulatory Visit: Payer: 59 | Admitting: Family Medicine

## 2021-01-10 ENCOUNTER — Ambulatory Visit (INDEPENDENT_AMBULATORY_CARE_PROVIDER_SITE_OTHER): Payer: 59

## 2021-01-10 ENCOUNTER — Encounter: Payer: Self-pay | Admitting: Family Medicine

## 2021-01-10 ENCOUNTER — Other Ambulatory Visit: Payer: Self-pay

## 2021-01-10 VITALS — BP 118/82 | HR 74 | Temp 97.6°F | Ht 69.0 in | Wt 222.2 lb

## 2021-01-10 DIAGNOSIS — R079 Chest pain, unspecified: Secondary | ICD-10-CM

## 2021-01-10 DIAGNOSIS — Z1159 Encounter for screening for other viral diseases: Secondary | ICD-10-CM | POA: Diagnosis not present

## 2021-01-10 DIAGNOSIS — K219 Gastro-esophageal reflux disease without esophagitis: Secondary | ICD-10-CM | POA: Diagnosis not present

## 2021-01-10 LAB — CBC WITH DIFFERENTIAL/PLATELET
Basophils Absolute: 0.1 10*3/uL (ref 0.0–0.1)
Basophils Relative: 0.8 % (ref 0.0–3.0)
Eosinophils Absolute: 0.2 10*3/uL (ref 0.0–0.7)
Eosinophils Relative: 3.2 % (ref 0.0–5.0)
HCT: 45.6 % (ref 39.0–52.0)
Hemoglobin: 15.5 g/dL (ref 13.0–17.0)
Lymphocytes Relative: 29.4 % (ref 12.0–46.0)
Lymphs Abs: 2 10*3/uL (ref 0.7–4.0)
MCHC: 34.1 g/dL (ref 30.0–36.0)
MCV: 88 fl (ref 78.0–100.0)
Monocytes Absolute: 0.6 10*3/uL (ref 0.1–1.0)
Monocytes Relative: 8.3 % (ref 3.0–12.0)
Neutro Abs: 4 10*3/uL (ref 1.4–7.7)
Neutrophils Relative %: 58.3 % (ref 43.0–77.0)
Platelets: 223 10*3/uL (ref 150.0–400.0)
RBC: 5.18 Mil/uL (ref 4.22–5.81)
RDW: 13.3 % (ref 11.5–15.5)
WBC: 6.8 10*3/uL (ref 4.0–10.5)

## 2021-01-10 LAB — COMPREHENSIVE METABOLIC PANEL
ALT: 26 U/L (ref 0–53)
AST: 25 U/L (ref 0–37)
Albumin: 4.1 g/dL (ref 3.5–5.2)
Alkaline Phosphatase: 96 U/L (ref 39–117)
BUN: 17 mg/dL (ref 6–23)
CO2: 27 mEq/L (ref 19–32)
Calcium: 9.2 mg/dL (ref 8.4–10.5)
Chloride: 105 mEq/L (ref 96–112)
Creatinine, Ser: 0.96 mg/dL (ref 0.40–1.50)
GFR: 93.54 mL/min (ref >60.00)
Glucose, Bld: 86 mg/dL (ref 70–99)
Potassium: 4.2 mEq/L (ref 3.5–5.1)
Sodium: 137 mEq/L (ref 135–145)
Total Bilirubin: 0.6 mg/dL (ref 0.2–1.2)
Total Protein: 7 g/dL (ref 6.0–8.3)

## 2021-01-10 MED ORDER — NITROGLYCERIN 0.4 MG SL SUBL
0.4000 mg | SUBLINGUAL_TABLET | SUBLINGUAL | 3 refills | Status: DC | PRN
Start: 2021-01-10 — End: 2021-04-11

## 2021-01-10 MED ORDER — OMEPRAZOLE 40 MG PO CPDR
40.0000 mg | DELAYED_RELEASE_CAPSULE | Freq: Every day | ORAL | 0 refills | Status: DC
Start: 2021-01-10 — End: 2021-01-23

## 2021-01-13 LAB — HEPATITIS C ANTIBODY
Hepatitis C Ab: NONREACTIVE
SIGNAL TO CUT-OFF: 0.01 (ref <1.00)

## 2021-01-20 ENCOUNTER — Encounter: Payer: Self-pay | Admitting: Cardiology

## 2021-01-20 ENCOUNTER — Ambulatory Visit: Payer: 59 | Admitting: Cardiology

## 2021-01-20 ENCOUNTER — Other Ambulatory Visit: Payer: Self-pay

## 2021-01-20 VITALS — BP 127/73 | HR 71 | Ht 68.0 in | Wt 225.6 lb

## 2021-01-20 DIAGNOSIS — E781 Pure hyperglyceridemia: Secondary | ICD-10-CM

## 2021-01-20 DIAGNOSIS — R079 Chest pain, unspecified: Secondary | ICD-10-CM

## 2021-01-20 DIAGNOSIS — N528 Other male erectile dysfunction: Secondary | ICD-10-CM | POA: Diagnosis not present

## 2021-01-20 NOTE — Progress Notes (Signed)
Primary Care Provider: [name], MD Cardiologist: Glenetta Hew, MD Electrophysiologist: None  Clinic Note: Chief Complaint  Patient presents with  . New Patient (Initial Visit)  . Chest Pain    ===================================  ASSESSMENT/PLAN   Problem List Items Addressed This Visit    Exertional chest pain - Primary    He describes a tightness in his chest associated with exertion and exertional dyspnea as well.  One episode he had while playing basketball was the most prominent, but he also describes as tightness in his chest on the treadmill that is been ongoing for several weeks now.  His father had a risk of cardiac disease but he was ordered that was a long-term smoker.  He is otherwise pretty healthy.  The symptoms he described to me are definitely described to Dr. Yong Channel.  Some of the description of his symptoms are quite classic, but there are also parts that are somewhat atypical. ->  Exertional dyspnea and tightness in the chest wall on treadmill seems more concerning than the symptom while shooting basketball.  That seem more musculoskeletal.  Plan: Ischemic evaluation with GXT.       Relevant Orders   EXERCISE TOLERANCE TEST (ETT)   Cardiac Stress Test: Informed Consent Details: Physician/Practitioner Attestation; Transcribe to consent form and obtain patient signature   Erectile dysfunction    Was started on Cialis by his PCP.  This can sometimes be a harbinger of other vascular disease including CAD.  Pending results of GXT, may consider the possibility of Coronary Calcium Score for baseline cardiovascular risk assessment.      Hypertriglyceridemia (Chronic)    Most recent panel showed triglycerides 122 and LDL 94.  Pretty well controlled based on his risk.  Pending ischemic evaluation may be more aggressive.      Relevant Orders   EXERCISE TOLERANCE TEST (ETT)   Cardiac Stress Test: Informed Consent Details: Physician/Practitioner  Attestation; Transcribe to consent form and obtain patient signature      ===================================  HPI:    KASEN ADDUCI is a 50 y.o. male with a PMH notable for borderline hyperlipidemia, mild obesity and intermediate family history of CAD (father with MI at 70) who is being seen today for the evaluation of CHEST PAIN/TIGHTNESS AND SHORTNESS OF BREATH WITH EXERTION at the request of [name], MD.  Recent Hospitalizations/Illnesses:   Sick with Covid in [place] members of his family were sick.  There is sort of a staggered presentation with a couple of the children, followed by his wife and then him.   TRAVIUS CROCHET was seen by Dr. Yong Channel on February 24 for an episode of chest pain.  He says he has gone back to the gym And after he noted his membership.  Walks on treadmill maybe 10 minutes at a time and then does light weightlifting.  He likes to "heavy weights ".  The episode in question was in December-was playing basketball his daughter (a pickup game without warning up), after taking 2 or 3 shots he started having a chest tightness sensation of difficulty breathing.  He "took a timeout to check his messages and felt better.  However, he had similar symptoms when he went back to playing again.  He also noted some episodes with running on a treadmill tightness in his chest radiating to shoulder. => He also acknowledges drinking RED BULL or other energy drinks after exercise, to help him recover..   Reviewed  CV studies:  The following studies were reviewed today: (if available, images/films reviewed: From Epic Chart or Care Everywhere) . None:   Interval History:   NELL SCHRACK presents here today discussed the episode of chest discomfort back in December.  He pretty much describes it as noted above.  8 he says that he did not really warm up before exercise size and that day, and thinks that may have had some to do with it.  He thought maybe he pulled a  muscle, but was concerned because he is also noticing getting short of breath if he goes heart treadmill.  He does not get short of breath with routine walking, lifting weights.  This past weekend he was all mall walk with his family chasing to the younger kids on bicycles, and did not have any chest tightness, or dyspnea.  He has occasional off-and-on fluttering sensations in his chest that lasts a few seconds.  Nothing concerning.  Nothing prolonged.  CV Review of Symptoms (Summary): positive for - chest pain, dyspnea on exertion, palpitations and As noted negative for - edema, orthopnea, paroxysmal nocturnal dyspnea, rapid heart rate, shortness of breath or Lightheadedness or dizziness, syncope/near syncope or TIA/amaurosis fugax, claudication  The patient does not have symptoms concerning for COVID-19 infection (fever, chills, cough, or new shortness of breath).   REVIEWED OF SYSTEMS   Review of Systems  Constitutional: Negative for malaise/fatigue and weight loss.  HENT: Negative for congestion and nosebleeds.   Respiratory: Negative for cough and shortness of breath (Only on the treadmill if he goes hard).   Cardiovascular: Positive for chest pain (Per HPI) and palpitations (Short burst of flip-flopping spells). Negative for leg swelling.  Gastrointestinal: Negative for blood in stool and melena.  Genitourinary: Negative for hematuria.  Musculoskeletal: Negative for joint pain and myalgias.  Neurological: Negative for dizziness, seizures and weakness.  Psychiatric/Behavioral: Negative for depression and memory loss. The patient is not nervous/anxious and does not have insomnia.    I have reviewed and (if needed) personally updated the patient's problem list, medications, allergies, past medical and surgical history, social and family history.   PAST MEDICAL HISTORY   Past Medical History:  Diagnosis Date  . ALLERGIC RHINITIS 08/29/2007  . BACK PAIN 01/08/2009  . GASTROENTERITIS,  ACUTE 01/08/2009  . GERD 08/29/2007  . HEARING LOSS 05/10/2008  . 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 Shingles outbreak 06/24/2011  . SKIN LESION 10/28/2010    PAST SURGICAL HISTORY   Past Surgical History:  Procedure Laterality Date  . pilonidal cystectomy      Immunization History  Administered Date(s) Administered  . Influenza Split 09/20/2012  . Influenza,inj,Quad PF,6+ Mos 08/27/2017  . Influenza-Unspecified 09/16/2011  . PFIZER(Purple Top)SARS-COV-2 Vaccination 03/02/2020, 03/26/2020  . Td 08/16/2009    MEDICATIONS/ALLERGIES   Current Meds  Medication Sig  . aspirin EC 81 MG tablet Take 81 mg by mouth daily. Swallow whole.  . nitroGLYCERIN (NITROSTAT) 0.4 MG SL tablet Place 1 tablet (0.4 mg total) under the tongue every 5 (five) minutes as needed for chest pain.  Marland Kitchen omeprazole (PRILOSEC) 40 MG capsule Take 1 capsule (40 mg total) by mouth daily.  . tadalafil (CIALIS) 20 MG tablet Take 1 tablet (20 mg total) by mouth every 3 (three) days.    No Known Allergies  SOCIAL HISTORY/FAMILY HISTORY   Reviewed in Epic:  Pertinent findings:  Social History   Tobacco Use  . Smoking status: Never Smoker  . Smokeless tobacco: Never Used  Substance Use Topics  .  Alcohol use: Yes    Alcohol/week: 6.0 standard drinks    Types: 6 Standard drinks or equivalent per week  . Drug use: No   Social History   Social History Narrative   Married 2008 with 5 kids (12, 11, 8, 5, 2 in 2022).    5th child 2019.       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   He also does walk on the treadmill.   Usually exercises about 40 minutes a day 3 days a week.  He also tries occasionally gets up and walks around the office throughout the day.     OBJCTIVE -PE, EKG, labs   Wt Readings from Last 3 Encounters:  01/20/21 225 lb 9.6 oz (102.3 kg)  01/10/21 222 lb 3.2 oz (100.8 kg)  04/12/20 226 lb (102.5 kg)    Physical Exam: BP 127/73   Pulse 71   Ht 5' 8"  (1.727 m)   Wt 225 lb 9.6 oz (102.3 kg)   SpO2 95%   BMI 34.30 kg/m  Physical Exam Vitals reviewed.  Constitutional:      General: He is not in acute distress.    Appearance: Normal appearance. He is obese. He is not ill-appearing or toxic-appearing.     Comments: Well-groomed.  Well-nourished.  HENT:     Head: Normocephalic and atraumatic.  Neck:     Vascular: No carotid bruit or JVD.  Cardiovascular:     Rate and Rhythm: Normal rate and regular rhythm.  No extrasystoles are present.    Chest Wall: PMI is not displaced.     Pulses: Normal pulses.     Heart sounds: Normal heart sounds. No murmur heard. No friction rub. No gallop.   Pulmonary:     Effort: Pulmonary effort is normal. No respiratory distress.     Breath sounds: Normal breath sounds.  Chest:     Chest wall: No tenderness.  Abdominal:     General: There is no distension.     Palpations: Abdomen is soft. There is no mass.     Tenderness: There is no abdominal tenderness.     Comments: Obese.  No HSM  Musculoskeletal:        General: No swelling. Normal range of motion.     Cervical back: Normal range of motion and neck supple.  Skin:    General: Skin is warm and dry.     Coloration: Skin is not pale.  Neurological:     General: No focal deficit present.     Mental Status: He is alert and oriented to person, place, and time.     Cranial Nerves: No cranial nerve deficit.     Gait: Gait normal.  Psychiatric:        Mood and Affect: Mood normal.        Behavior: Behavior normal.        Thought Content: Thought content normal.        Judgment: Judgment normal.     Adult ECG Report From PCP  Rate: 75 ;  Rhythm: normal sinus rhythm and Normal axis, intervals and durations.;   Narrative  Interpretation: Normal EKG  Recent Labs: Reviewed Lab Results  Component Value Date   CHOL  155 04/12/2020   HDL 36.70 (L) 04/12/2020   LDLCALC 94 04/12/2020   LDLDIRECT 89.1 10/01/2011   TRIG 122.0 04/12/2020   CHOLHDL 4 04/12/2020   Lab Results  Component Value Date   CREATININE 0.96 01/10/2021   BUN 17 01/10/2021   NA 137 01/10/2021   K 4.2 01/10/2021   CL 105 01/10/2021   CO2 27 01/10/2021   CBC Latest Ref Rng & Units 01/10/2021 04/12/2020 05/08/2019  WBC 4.0 - 10.5 K/uL 6.8 5.7 5.1  Hemoglobin 13.0 - 17.0 g/dL 15.5 15.4 15.8  Hematocrit 39.0 - 52.0 % 45.6 45.8 47.1  Platelets 150.0 - 400.0 K/uL 223.0 197.0 191.0    Lab Results  Component Value Date   TSH 2.48 04/16/2016    ==================================================  COVID-19 Education: The signs and symptoms of COVID-19 were discussed with the patient and how to seek care for testing (follow up with PCP or arrange E-visit).   The importance of social distancing and COVID-19 vaccination was discussed today. The patient is practicing social distancing & Masking.   I spent a total of 34mnutes with the patient spent in direct patient consultation.  Additional time spent with chart review  / charting (studies, outside notes, etc): 20 min Total Time: 40 min  Current medicines are reviewed at length with the patient today.  (+/- concerns) none  This visit occurred during the SARS-CoV-2 public health emergency.  Safety protocols were in place, including screening questions prior to the visit, additional usage of staff PPE, and extensive cleaning of exam room while observing appropriate contact time as indicated for disinfecting solutions.  Notice: This dictation was prepared with Dragon dictation along with smaller phrase technology. Any transcriptional errors that result from this process are unintentional and may not be corrected upon review.  Patient Instructions / Medication Changes & Studies & Tests Ordered    Patient Instructions  Medication Instructions:   No changes *If you need a refill on your cardiac medications before your next appointment, please call your pharmacy*   Lab Work:  Not needed   Testing/Procedures: You will need a COVID-19  test  3 days prior to your procedure.  This is a Drive [place] Visit [place] [address] [place] [address] Someone will direct you to the appropriate testing line. Stay in your car and someone will be with you shortly. [place] be schedule [place] you have an exercise tolerance test. Please also follow instruction sheet, as given.   Follow-Up: [facility] you and your health needs are our priority.  As part of our continuing mission to provide you with exceptional heart care, we have created designated Provider Care Teams.  These Care Teams include your primary Cardiologist (physician) and Advanced Practice Providers (APPs -  Physician Assistants and Nurse Practitioners) who all work together to provide you with the care you need, when you need it.     Your next appointment:   1 month(s)  The format for your next appointment:   virtual or in person  Provider:   DGlenetta Hew MD       Studies Ordered:   Orders Placed This Encounter  Procedures  . Cardiac Stress Test: Informed Consent Details: Physician/Practitioner Attestation; Transcribe to consent form and obtain patient signature  . EXERCISE TOLERANCE TEST (ETT)     DGlenetta Hew M.D., M.S. Interventional Cardiologist   Pager # [phone] # [phone] [place] [address]   Thank you for choosing [facility] at [facility]!!

## 2021-01-20 NOTE — Patient Instructions (Addendum)
Medication Instructions:   No changes *If you need a refill on your cardiac medications before your next appointment, please call your pharmacy*   Lab Work:  Not needed   Testing/Procedures: You will need a COVID-19  test  3 days prior to your procedure.  This is a Drive Up Visit at [address] [place], [place] [phone]. Someone will direct you to the appropriate testing line. Stay in your car and someone will be with you shortly. Will be schedule at [place] has requested that you have an exercise tolerance test. Please also follow instruction sheet, as given.   Follow-Up: At [facility], you and your health needs are our priority.  As part of our continuing mission to provide you with exceptional heart care, we have created designated Provider Care Teams.  These Care Teams include your primary Cardiologist (physician) and Advanced Practice Providers (APPs -  Physician Assistants and Nurse Practitioners) who all work together to provide you with the care you need, when you need it.     Your next appointment:   1 month(s)  The format for your next appointment:   virtual or in person  Provider:   Glenetta Hew, MD

## 2021-01-21 ENCOUNTER — Encounter: Payer: Self-pay | Admitting: Cardiology

## 2021-01-21 NOTE — Assessment & Plan Note (Signed)
He describes a tightness in his chest associated with exertion and exertional dyspnea as well.  One episode he had while playing basketball was the most prominent, but he also describes as tightness in his chest on the treadmill that is been ongoing for several weeks now.  His father had a risk of cardiac disease but he was ordered that was a long-term smoker.  He is otherwise pretty healthy.  The symptoms he described to me are definitely described to Dr. Yong Channel.  Some of the description of his symptoms are quite classic, but there are also parts that are somewhat atypical. ->  Exertional dyspnea and tightness in the chest wall on treadmill seems more concerning than the symptom while shooting basketball.  That seem more musculoskeletal.  Plan: Ischemic evaluation with GXT.

## 2021-01-21 NOTE — Assessment & Plan Note (Signed)
Was started on Cialis by his PCP.  This can sometimes be a harbinger of other vascular disease including CAD.  Pending results of GXT, may consider the possibility of Coronary Calcium Score for baseline cardiovascular risk assessment.

## 2021-01-21 NOTE — Assessment & Plan Note (Signed)
Most recent panel showed triglycerides 122 and LDL 94.  Pretty well controlled based on his risk.  Pending ischemic evaluation may be more aggressive.

## 2021-01-23 ENCOUNTER — Other Ambulatory Visit: Payer: Self-pay | Admitting: Family Medicine

## 2021-01-24 ENCOUNTER — Other Ambulatory Visit (HOSPITAL_COMMUNITY)
Admission: RE | Admit: 2021-01-24 | Discharge: 2021-01-24 | Disposition: A | Discharge status: Home / Self Care | Payer: 59 | Source: Ambulatory Visit | Attending: Cardiology | Admitting: Cardiology

## 2021-01-24 ENCOUNTER — Telehealth (HOSPITAL_COMMUNITY): Payer: Self-pay | Admitting: *Deleted

## 2021-01-24 DIAGNOSIS — Z20822 Contact with and (suspected) exposure to covid-19: Secondary | ICD-10-CM | POA: Diagnosis not present

## 2021-01-24 DIAGNOSIS — Z01812 Encounter for preprocedural laboratory examination: Secondary | ICD-10-CM | POA: Insufficient documentation

## 2021-01-24 LAB — SARS CORONAVIRUS 2 (TAT 6-24 HRS): SARS Coronavirus 2: NEGATIVE

## 2021-01-24 NOTE — Telephone Encounter (Signed)
Close encounter 

## 2021-01-28 ENCOUNTER — Other Ambulatory Visit: Payer: Self-pay

## 2021-01-28 ENCOUNTER — Ambulatory Visit (HOSPITAL_COMMUNITY)
Admission: RE | Admit: 2021-01-28 | Discharge: 2021-01-28 | Disposition: A | Discharge status: Home / Self Care | Payer: 59 | Source: Ambulatory Visit | Attending: Cardiovascular Disease | Admitting: Cardiovascular Disease

## 2021-01-28 DIAGNOSIS — R079 Chest pain, unspecified: Secondary | ICD-10-CM

## 2021-01-28 DIAGNOSIS — E781 Pure hyperglyceridemia: Secondary | ICD-10-CM | POA: Insufficient documentation

## 2021-01-28 LAB — EXERCISE TOLERANCE TEST
Estimated workload: 10.3 METS
Exercise duration (min): 9 min
Exercise duration (sec): 11 s
MPHR: 172 {beats}/min
Peak HR: 166 {beats}/min
Percent HR: 96 %
Rest HR: 83 {beats}/min

## 2021-02-13 NOTE — Telephone Encounter (Signed)
Good Morning  Mr Coombs    This is  the comment from Dr Ellyn Hack  That is probably with this is is musculoskeletal chest pain versus the fancy name for costochondritis or chest wall pain.    Glenetta Hew, MD    Would recommend you following up with your primary . Ivin Booty RN

## 2021-02-21 ENCOUNTER — Telehealth (INDEPENDENT_AMBULATORY_CARE_PROVIDER_SITE_OTHER): Payer: 59 | Admitting: Cardiology

## 2021-02-21 DIAGNOSIS — R079 Chest pain, unspecified: Secondary | ICD-10-CM | POA: Diagnosis not present

## 2021-02-21 NOTE — Progress Notes (Signed)
Virtual Visit via Telephone Note   This visit type was conducted due to national recommendations for restrictions regarding the COVID-19 Pandemic (e.g. social distancing) in an effort to limit this patient's exposure and mitigate transmission in our community.  Due to his co-morbid illnesses, this patient is at least at moderate risk for complications without adequate follow up.  This format is felt to be most appropriate for this patient at this time.  The patient did not have access to video technology/had technical difficulties with video requiring transitioning to audio format only (telephone).  All issues noted in this document were discussed and addressed.  No physical exam could be performed with this format.  Please refer to the patient's chart for his  consent to telehealth for [facility].   Patient has given verbal permission to conduct this visit via virtual appointment and to bill insurance 03/01/2021 11:04 PM     Evaluation Performed:  Follow-up visit  Date:  03/01/2021   ID:  Tanner [name], DOB Sep 12, 1971, MRN [id number]  Patient Location: Home Provider Location: Home Office  PCP:  [name], MD  Cardiologist:  Glenetta Hew, MD  Electrophysiologist:  None   Chief Complaint:   Chief Complaint  Patient presents with  . pt offers no complaints  . Follow-up    GXT/ETT results    ====================================  ASSESSMENT & PLAN:    Problem List Items Addressed This Visit    Exertional chest pain    No further options.  GXT normal.  Going forward, we may want to consider the possibility of a coronary calcium score for based on this evaluation.  For now I think a is stable cardiac standpoint.         ====================================  History of Present Illness:    Tanner [name] is a 50 y.o. male with PMH notable for borderline HLD HTN with mild obesity and immediate family history of CAD (father at age 39 had an MI) who presents via  audio/video conferencing for a telehealth visit today as a 1 month follow-up.  Tanner [name] was last seen 01/20/2021 for evaluation of some exertional chest pain and tightness.  Also an off-and-on fluttering sensation in his chest.  Had chest pain while playing basketball with his daughter. ->  Symptoms somewhat atypical.-Evaluated by ETT.  Hospitalizations:  . None   Recent - Interim CV studies:   The following studies were reviewed today:  ETT 3/15/202: No ischemia on stress ECG. Exercise time: 9 min 11 sec; Workload: 10.3 METS, normal exercise capacity; Test stopped due to: leg discomfort, patient request (he was going to have to start running); BP response: normal; HR response: normal; Rhythm: SR  Inerval History   Tanner [name] is being seen via telemedicine stating that he has not really had any more chest pain.  He is back doing exercise.  He changed over to elliptical from treadmill and is doing a lot better.  He had no further episodes of chest tightness or pressure.  He is working out routinely and had no chest pain or pressure noted on his stress test either.  He is relatively sure that the chest discomfort he had with musculoskeletal in nature.  No other cardiac symptoms of PND, orthopnea or edema.  No significant palpitations.  Heart racing episodes of improved.  No syncope/near syncope or TIA/amaurosis fugax, claudication.  ROS:  Please see the history of present illness.    The patient does not have symptoms concerning for  COVID-19 infection (fever, chills, cough, or new shortness of breath).  Review of Systems  Constitutional: Negative for malaise/fatigue and weight loss.  Respiratory: Negative for shortness of breath.   Cardiovascular: Negative for leg swelling.  Musculoskeletal: Negative for joint pain.    Past Medical History:  Diagnosis Date  . ALLERGIC RHINITIS 08/29/2007  . BACK PAIN 01/08/2009  . GASTROENTERITIS, ACUTE 01/08/2009  . GERD 08/29/2007  . HEARING  LOSS 05/10/2008  . 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 Shingles outbreak 06/24/2011  . SKIN LESION 10/28/2010   Past Surgical History:  Procedure Laterality Date  . pilonidal cystectomy       Current Meds  Medication Sig  . nitroGLYCERIN (NITROSTAT) 0.4 MG SL tablet Place 1 tablet (0.4 mg total) under the tongue every 5 (five) minutes as needed for chest pain.  Marland Kitchen omeprazole (PRILOSEC) 40 MG capsule TAKE 1 CAPSULE (40 MG TOTAL) BY MOUTH DAILY.  . tadalafil (CIALIS) 20 MG tablet Take 1 tablet (20 mg total) by mouth every 3 (three) days.     Allergies:   Patient has no known allergies.   Social History   Tobacco Use  . Smoking status: Never Smoker  . Smokeless tobacco: Never Used  Substance Use Topics  . Alcohol use: Yes    Alcohol/week: 6.0 standard drinks    Types: 6 Standard drinks or equivalent per week  . Drug use: No     Family Hx: The patient's family history includes Coronary artery disease (age of onset: 38) in his father; Depression in an other family member; Lung cancer (age of onset: 65) in his mother; Valvular heart disease in his father.   Labs/Other Tests and Data Reviewed:    EKG:  No ECG reviewed.  Recent Labs: 01/10/2021: ALT 26; BUN 17; Creatinine, Ser 0.96; Hemoglobin 15.5; Platelets 223.0; Potassium 4.2; Sodium 137   Recent Lipid Panel Lab Results  Component Value Date/Time   CHOL 155 04/12/2020 10:08 AM   TRIG 122.0 04/12/2020 10:08 AM   HDL 36.70 (L) 04/12/2020 10:08 AM   CHOLHDL 4 04/12/2020 10:08 AM   LDLCALC 94 04/12/2020 10:08 AM   LDLDIRECT 89.1 10/01/2011 02:20 PM    Wt Readings from Last 3 Encounters:  02/21/21 222 lb (100.7 kg)  01/20/21 225 lb 9.6 oz (102.3 kg)  01/10/21 222 lb 3.2 oz (100.8 kg)     Objective:    Vital Signs:  Ht 5' 8"  (1.727 m)   Wt 222 lb (100.7 kg)   BMI 33.75 kg/m   VITAL SIGNS:  reviewed Pleasnt male in no acute distress. A&O x  3.  normal Mood & Affect Non-labored  respirations  ==========================================  COVID-19 Education: The signs and symptoms of COVID-19 were discussed with the patient and how to seek care for testing (follow up with PCP or arrange E-visit).   The importance of social distancing was discussed today.  Time:   Today, I have spent 25 minutes with the patient with telehealth technology discussing the above problems.  We reviewed his treadmill stress test and what it means.  We also reviewed palpitations that seem to improve. An additional 85mnutes spent charting (reviewing prior notes, hospital records, studies, labs etc.) Total 33 minutes   Medication Adjustments/Labs and Tests Ordered: Current medicines are reviewed at length with the patient today.  Concerns regarding medicines are outlined above.   Patient Instructions  Medication Instructions:    *If you need a refill on your cardiac medications before your next appointment, please call your pharmacy*  Lab Work: Continue with PCP  If you have labs (blood work) drawn today and your tests are completely normal, you will receive your results only by: Marland Kitchen MyChart Message (if you have MyChart) OR . A paper copy in the mail If you have any lab test that is abnormal or we need to change your treatment, we will call you to review the results.   Testing/Procedures: None for now   Follow-Up: At [facility], you and your health needs are our priority.  As part of our continuing mission to provide you with exceptional heart care, we have created designated Provider Care Teams.  These Care Teams include your primary Cardiologist (physician) and Advanced Practice Providers (APPs -  Physician Assistants and Nurse Practitioners) who all work together to provide you with the care you need, when you need it.  We recommend signing up for the patient portal called "MyChart".  Sign up information is provided on this After Visit Summary.  MyChart is used to connect  with patients for Virtual Visits (Telemedicine).  Patients are able to view lab/test results, encounter notes, upcoming appointments, etc.  Non-urgent messages can be sent to your provider as well.   To learn more about what you can do with MyChart, go to [email or web address].    Your next appointment:   As needed within next 3 years as an active patient.   The format for your next appointment:   In Person  Provider:   Glenetta Hew, MD   Other Instructions Keep exercising - but pay more attention to CARDIO vs. Power; use light weights - more reps to tone muscle.     Signed, Glenetta Hew, MD  03/01/2021 11:04 PM    Tanner [name]

## 2021-02-21 NOTE — Patient Instructions (Signed)
Medication Instructions:    *If you need a refill on your cardiac medications before your next appointment, please call your pharmacy*   Lab Work: Continue with PCP  If you have labs (blood work) drawn today and your tests are completely normal, you will receive your results only by: Marland Kitchen MyChart Message (if you have MyChart) OR . A paper copy in the mail If you have any lab test that is abnormal or we need to change your treatment, we will call you to review the results.   Testing/Procedures: None for now   Follow-Up: At [facility], you and your health needs are our priority.  As part of our continuing mission to provide you with exceptional heart care, we have created designated Provider Care Teams.  These Care Teams include your primary Cardiologist (physician) and Advanced Practice Providers (APPs -  Physician Assistants and Nurse Practitioners) who all work together to provide you with the care you need, when you need it.  We recommend signing up for the patient portal called "MyChart".  Sign up information is provided on this After Visit Summary.  MyChart is used to connect with patients for Virtual Visits (Telemedicine).  Patients are able to view lab/test results, encounter notes, upcoming appointments, etc.  Non-urgent messages can be sent to your provider as well.   To learn more about what you can do with MyChart, go to [email or web address].    Your next appointment:   As needed within next 3 years as an active patient.   The format for your next appointment:   In Person  Provider:   Glenetta Hew, MD   Other Instructions Keep exercising - but pay more attention to CARDIO vs. Power; use light weights - more reps to tone muscle.

## 2021-02-21 NOTE — Progress Notes (Signed)
Tried to reach pt to get ready for virtual ov, though no answer. Left message for the pt to call back to get ready for virtual ov. Tried to reach x 2 on all numbers on file, [phone] left message to call back, [phone], recording comes on (the party you are trying to reach is no available).  Pt states he wanted stopped drinking his energy drinks since his last ov. Pt was not able to have a BP and HR available for today visit.

## 2021-03-01 ENCOUNTER — Encounter: Payer: Self-pay | Admitting: Cardiology

## 2021-03-01 NOTE — Assessment & Plan Note (Signed)
No further options.  GXT normal.  Going forward, we may want to consider the possibility of a coronary calcium score for based on this evaluation.  For now I think a is stable cardiac standpoint.

## 2021-04-08 ENCOUNTER — Emergency Department (HOSPITAL_COMMUNITY): Payer: 59

## 2021-04-08 ENCOUNTER — Emergency Department (HOSPITAL_COMMUNITY)
Admission: EM | Admit: 2021-04-08 | Discharge: 2021-04-08 | Disposition: A | Discharge status: Home / Self Care | Payer: 59 | Attending: Emergency Medicine | Admitting: Emergency Medicine

## 2021-04-08 ENCOUNTER — Encounter: Payer: Self-pay | Admitting: Family Medicine

## 2021-04-08 ENCOUNTER — Telehealth: Payer: Self-pay

## 2021-04-08 ENCOUNTER — Other Ambulatory Visit: Payer: Self-pay

## 2021-04-08 DIAGNOSIS — R0789 Other chest pain: Secondary | ICD-10-CM | POA: Diagnosis not present

## 2021-04-08 DIAGNOSIS — Z5321 Procedure and treatment not carried out due to patient leaving prior to being seen by health care provider: Secondary | ICD-10-CM | POA: Insufficient documentation

## 2021-04-08 LAB — CBC
HCT: 46.3 % (ref 39.0–52.0)
Hemoglobin: 15.9 g/dL (ref 13.0–17.0)
MCH: 29.9 pg (ref 26.0–34.0)
MCHC: 34.3 g/dL (ref 30.0–36.0)
MCV: 87.2 fL (ref 80.0–100.0)
Platelets: 217 10*3/uL (ref 150–400)
RBC: 5.31 MIL/uL (ref 4.22–5.81)
RDW: 12.9 % (ref 11.5–15.5)
WBC: 6.6 10*3/uL (ref 4.0–10.5)
nRBC: 0 % (ref 0.0–0.2)

## 2021-04-08 LAB — TROPONIN I (HIGH SENSITIVITY): Troponin I (High Sensitivity): 3 ng/L (ref <18)

## 2021-04-08 LAB — BASIC METABOLIC PANEL
Anion gap: 6 (ref 5–15)
BUN: 12 mg/dL (ref 6–20)
CO2: 23 mmol/L (ref 22–32)
Calcium: 8.8 mg/dL — ABNORMAL LOW (ref 8.9–10.3)
Chloride: 110 mmol/L (ref 98–111)
Creatinine, Ser: 0.85 mg/dL (ref 0.61–1.24)
GFR, Estimated: >60 mL/min (ref >60)
Glucose, Bld: 83 mg/dL (ref 70–99)
Potassium: 4 mmol/L (ref 3.5–5.1)
Sodium: 139 mmol/L (ref 135–145)

## 2021-04-08 NOTE — Telephone Encounter (Signed)
Nurse Assessment Nurse: Claiborne Billings, RN, Kim Date/Time (Eastern Time): 04/08/2021 11:58:50 AM Confirm and document reason for call. If symptomatic, describe symptoms. ---Caller states he is having "strange little pinches in my chest that come and go", also feels lightheaded. Pain occurs in upper left side of chest, last up to a few seconds, goes away for several minutes and return. Does the patient have any new or worsening symptoms? ---Yes Will a triage be completed? ---Yes Related visit to physician within the last 2 weeks? ---No Does the PT have any chronic conditions? (i.e. diabetes, asthma, this includes High risk factors for pregnancy, etc.) ---No Is this a behavioral health or substance abuse call? ---No Guidelines Guideline Title Affirmed Question Affirmed Notes Nurse Date/Time (Eastern Time) Chest Pain [1] Chest pain (or "angina") comes and goes AND [2] is happening more often (increasing in frequency) or getting worse (increasing in [place], [place], Maudie Mercury 04/08/2021 12:00:36 PM PLEASE NOTE: All timestamps contained within this report are represented as [place] Standard Time. CONFIDENTIALTY NOTICE: This fax transmission is intended only for the addressee. It contains information that is legally privileged, confidential or otherwise protected from use or disclosure. If you are not the intended recipient, you are strictly prohibited from reviewing, disclosing, copying using or disseminating any of this information or taking any action in reliance on or regarding this information. If you have received this fax in error, please notify us immediately by telephone so that we can arrange for its return to [place]. Phone: [phone], Toll-Free: [phone], Fax: [phone] Page: 2 of 2 Call Id: [id number] Guidelines Guideline Title Affirmed Question Affirmed Notes Nurse Date/Time Eilene Ghazi Time) severity) (Exception: chest pains that last only a few seconds) Disp. Time Eilene Ghazi Time)  Disposition Final User 04/08/2021 11:57:31 AM Send to Urgent [place], [place] 04/08/2021 12:04:46 PM Go to ED Now Yes Claiborne Billings, RN, Max Sane Disagree/Comply Comply Caller Understands Yes PreDisposition InappropriateToAsk Care Advice Given Per Guideline GO TO ED NOW: * You need to be seen in the Emergency Department. * Go to the ED at ___________ [place] now. Drive carefully. ANOTHER ADULT SHOULD DRIVE: * It is better and safer if another adult drives instead of you. BRING MEDICINES: * Bring a list of your current medicines when you go to the Emergency Department (ER). NOTHING BY MOUTH: * Do not eat or drink anything for now. CALL EMS IF: * Severe difficulty breathing occurs * Passes out or becomes too weak to stand * You become worse CARE ADVICE given per Chest Pain (Adult) guideline. Referrals GO TO FACILITY OTHER - SPECIFY

## 2021-04-08 NOTE — ED Triage Notes (Signed)
Pt reports left sided sharp chest pains since Saturday, pain typically come and go lasting a few seconds. Described as pinching. Denies SOb, nausea/ vomiting/ diaphoresis.

## 2021-04-08 NOTE — Telephone Encounter (Signed)
Pt scheduled for 05/27.

## 2021-04-08 NOTE — ED Notes (Signed)
Pt left due to not being seen quick enough

## 2021-04-08 NOTE — ED Provider Notes (Signed)
Emergency Medicine Provider Triage Evaluation Note  Tanner [name] , a 50 y.o. male  was evaluated in triage.  Pt complains of chest pain beginning a few days ago.  Episodes are described as a pinch, lasting more than a few seconds at a time, seem to becoming more frequent over the last 4 days, mild, nonradiating.  They do not seem to be associated with exertion. Patient does note he has a prescription for Cialis with last use 4 days ago.  Review of Systems  Positive: Chest pain Negative: Shortness of breath, nausea, vomiting, abdominal pain, back pain, dizziness, syncope, diaphoresis  Physical Exam  BP (!) 137/97 (BP Location: Left Arm)   Pulse 65   Temp 97.7 F (36.5 C) (Oral)   Resp 16   SpO2 99%  Gen:   Awake, no distress   Resp:  Normal effort  MSK:   Moves extremities without difficulty  Other:    Medical Decision Making  Medically screening exam initiated at 12:55 PM.  Appropriate orders placed.  Tanner [name] was informed that the remainder of the evaluation will be completed by another provider, this initial triage assessment does not replace that evaluation, and the importance of remaining in the ED until their evaluation is complete.     Lorayne Bender, PA-C 04/08/21 1302    [name], MD 04/10/21 1254

## 2021-04-09 ENCOUNTER — Emergency Department (HOSPITAL_BASED_OUTPATIENT_CLINIC_OR_DEPARTMENT_OTHER)
Admission: EM | Admit: 2021-04-09 | Discharge: 2021-04-10 | Disposition: A | Discharge status: Home / Self Care | Payer: 59 | Attending: Emergency Medicine | Admitting: Emergency Medicine

## 2021-04-09 ENCOUNTER — Other Ambulatory Visit: Payer: Self-pay

## 2021-04-09 ENCOUNTER — Encounter (HOSPITAL_BASED_OUTPATIENT_CLINIC_OR_DEPARTMENT_OTHER): Payer: Self-pay | Admitting: Obstetrics and Gynecology

## 2021-04-09 DIAGNOSIS — Z20822 Contact with and (suspected) exposure to covid-19: Secondary | ICD-10-CM | POA: Insufficient documentation

## 2021-04-09 DIAGNOSIS — J939 Pneumothorax, unspecified: Secondary | ICD-10-CM | POA: Insufficient documentation

## 2021-04-09 DIAGNOSIS — R079 Chest pain, unspecified: Secondary | ICD-10-CM | POA: Diagnosis present

## 2021-04-09 DIAGNOSIS — R0789 Other chest pain: Secondary | ICD-10-CM

## 2021-04-09 LAB — CBC
HCT: 45.2 % (ref 39.0–52.0)
Hemoglobin: 16 g/dL (ref 13.0–17.0)
MCH: 30.8 pg (ref 26.0–34.0)
MCHC: 35.4 g/dL (ref 30.0–36.0)
MCV: 86.9 fL (ref 80.0–100.0)
Platelets: 206 10*3/uL (ref 150–400)
RBC: 5.2 MIL/uL (ref 4.22–5.81)
RDW: 13.1 % (ref 11.5–15.5)
WBC: 7.7 10*3/uL (ref 4.0–10.5)
nRBC: 0 % (ref 0.0–0.2)

## 2021-04-09 LAB — TROPONIN I (HIGH SENSITIVITY): Troponin I (High Sensitivity): <2 ng/L (ref <18)

## 2021-04-09 LAB — BASIC METABOLIC PANEL
Anion gap: 8 (ref 5–15)
BUN: 15 mg/dL (ref 6–20)
CO2: 23 mmol/L (ref 22–32)
Calcium: 8.4 mg/dL — ABNORMAL LOW (ref 8.9–10.3)
Chloride: 106 mmol/L (ref 98–111)
Creatinine, Ser: 1.01 mg/dL (ref 0.61–1.24)
GFR, Estimated: >60 mL/min (ref >60)
Glucose, Bld: 98 mg/dL (ref 70–99)
Potassium: 3.7 mmol/L (ref 3.5–5.1)
Sodium: 137 mmol/L (ref 135–145)

## 2021-04-09 MED ORDER — SODIUM CHLORIDE 0.9 % IV BOLUS
1000.0000 mL | Freq: Once | INTRAVENOUS | Status: AC
  Administered 2021-04-09: 1000 mL via INTRAVENOUS

## 2021-04-09 NOTE — ED Triage Notes (Signed)
Patient reports to the ER for chest pain. Patient reports Saturday night he was feeling pinching feelings in his chest. Patient reports he left without being seen yesterday after taking blood work at [employer]

## 2021-04-09 NOTE — ED Provider Notes (Signed)
[facility] DEPT Provider Note   CSN: [id number] Arrival date & time: 04/09/21  2249     History Chief Complaint  Patient presents with  . Chest Pain    Tanner [name] is a 50 y.o. male.  Patient with a history of allergies, gastroenteritis, significant caffeine intake presenting with fatigue, generalized weakness and intermittent pinching sensation in his chest.  Patient states he has had intermittent pinching sensation of left side of his chest for the past 5 days coming and going.  Sensation last for few seconds at a time and resolves.  There is no radiation of the pain.  No shortness of breath, nausea, vomiting,.  Pain is not exertional or pleuritic. He states has had the pain multiple times throughout the weekend and into this week.  He was seen at [facility] last night but left without being seen after triage. Stated he has had increased generalized weakness and fatigue and feels weak all over.  He had an episode at home where he felt lightheaded and generally weak.  He did not fall.  He has no focal weakness, numbness or tingling.  No bowel or bladder incontinence. Reports significantly reducing his caffeine intake this week.  He continues to drink coffee but has stopped drinking his energy drinks which she normally has 2 or 3 times a week.  Last caffeine intake was 11 AM on May 24. Reports having a stress test earlier this year for exertional chest pain which was reassuring.  The history is provided by the patient.  Chest Pain Associated symptoms: dizziness, fatigue and weakness   Associated symptoms: no abdominal pain, no fever, no headache, no nausea, no shortness of breath and no vomiting        Past Medical History:  Diagnosis Date  . ALLERGIC RHINITIS 08/29/2007  . BACK PAIN 01/08/2009  . GASTROENTERITIS, ACUTE 01/08/2009  . GERD 08/29/2007  . HEARING LOSS 05/10/2008  . 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 Shingles outbreak 06/24/2011  . SKIN  LESION 10/28/2010    Patient Active Problem List   Diagnosis Date Noted  . Exertional chest pain 04/12/2020  . Degenerative disc disease, lumbar 05/24/2019  . Erectile dysfunction 07/31/2014  . Hypertriglyceridemia 10/06/2011  . Onychomycosis 09/14/2008  . Allergic rhinitis 08/29/2007  . GERD 08/29/2007  . CYST, PILONIDAL W/ABSCESS 08/29/2007    Past Surgical History:  Procedure Laterality Date  . pilonidal cystectomy         Family History  Problem Relation Age of Onset  . Lung cancer Mother 51       Long-term smoker  . Coronary artery disease Father 6       Diagnosis 68-had CABG along with valve replacement;. 34 at death- MI complications. father was smoker  . Valvular heart disease Father        rheumatic fever- replaced  . Depression Other     Social History   Tobacco Use  . Smoking status: Never Smoker  . Smokeless tobacco: Never Used  Vaping Use  . Vaping Use: Never used  Substance Use Topics  . Alcohol use: Yes    Alcohol/week: 6.0 standard drinks    Types: 6 Standard drinks or equivalent per week  . Drug use: No    Home Medications Prior to Admission medications   Medication Sig Start Date End Date Taking? Authorizing Provider  aspirin EC 81 MG tablet Take 81 mg by mouth daily. Swallow whole. Patient not taking: Reported on 02/21/2021    [provider]  nitroGLYCERIN (NITROSTAT) 0.4 MG SL tablet Place 1 tablet (0.4 mg total) under the tongue every 5 (five) minutes as needed for chest pain. 01/10/21   [name], MD  omeprazole (PRILOSEC) 40 MG capsule TAKE 1 CAPSULE (40 MG TOTAL) BY MOUTH DAILY. 01/23/21   [name], MD  tadalafil (CIALIS) 20 MG tablet Take 1 tablet (20 mg total) by mouth every 3 (three) days. 08/19/20   [name], MD    Allergies    Patient has no known allergies.  Review of Systems   Review of Systems  Constitutional: Positive for fatigue. Negative for appetite change and fever.  HENT: Negative for  congestion and rhinorrhea.   Eyes: Negative for visual disturbance.  Respiratory: Positive for chest tightness. Negative for shortness of breath.   Cardiovascular: Positive for chest pain.  Gastrointestinal: Negative for abdominal pain, nausea and vomiting.  Genitourinary: Negative for dysuria and hematuria.  Musculoskeletal: Negative for arthralgias and myalgias.  Skin: Negative for rash.  Neurological: Positive for dizziness, weakness and light-headedness. Negative for headaches.   all other systems are negative except as noted in the HPI and PMH.    Physical Exam Updated Vital Signs BP (!) 143/84 (BP Location: Left Arm)   Pulse 92   Temp 98.4 F (36.9 C) (Oral)   Resp 20   Ht 5' 8"  (1.727 m)   SpO2 98%   BMI 33.75 kg/m   Physical Exam Vitals and nursing note reviewed.  Constitutional:      General: He is not in acute distress.    Appearance: He is well-developed.  HENT:     Head: Normocephalic and atraumatic.     Mouth/Throat:     Pharynx: No oropharyngeal exudate.  Eyes:     Conjunctiva/sclera: Conjunctivae normal.     Pupils: Pupils are equal, round, and reactive to light.  Neck:     Comments: No meningismus. Cardiovascular:     Rate and Rhythm: Normal rate and regular rhythm.     Heart sounds: Normal heart sounds. No murmur heard.   Pulmonary:     Effort: Pulmonary effort is normal. No respiratory distress.     Breath sounds: Normal breath sounds.  Chest:     Chest wall: No tenderness.  Abdominal:     Palpations: Abdomen is soft.     Tenderness: There is no abdominal tenderness. There is no guarding or rebound.  Musculoskeletal:        General: No tenderness. Normal range of motion.     Cervical back: Normal range of motion and neck supple.  Skin:    General: Skin is warm.  Neurological:     General: No focal deficit present.     Mental Status: He is alert and oriented to person, place, and time. Mental status is at baseline.     Cranial Nerves: No  cranial nerve deficit.     Motor: No abnormal muscle tone.     Coordination: Coordination normal.     Comments: No ataxia on finger to nose bilaterally. No pronator drift. 5/5 strength throughout. CN 2-12 intact.Equal grip strength. Sensation intact.   Psychiatric:        Behavior: Behavior normal.     ED Results / Procedures / Treatments   Labs (all labs ordered are listed, but only abnormal results are displayed) Labs Reviewed  BASIC METABOLIC PANEL - Abnormal; Notable for the following components:      Result Value   Calcium 8.4 (*)  All other components within normal limits  D-DIMER, QUANTITATIVE - Abnormal; Notable for the following components:   D-Dimer, Quant 0.65 (*)    All other components within normal limits  URINALYSIS, ROUTINE W REFLEX MICROSCOPIC - Abnormal; Notable for the following components:   Color, Urine COLORLESS (*)    Specific Gravity, Urine <1.005 (*)    All other components within normal limits  RESP PANEL BY RT-PCR (FLU A&B, COVID) ARPGX2  CBC  CK  TSH  TROPONIN I (HIGH SENSITIVITY)  TROPONIN I (HIGH SENSITIVITY)    EKG EKG Interpretation  Date/Time:  Wednesday Apr 09 2021 22:58:27 EDT Ventricular Rate:  90 PR Interval:  166 QRS Duration: 98 QT Interval:  349 QTC Calculation: 427 R Axis:   57 Text Interpretation: Sinus rhythm Multiple ventricular premature complexes Consider anterior infarct No significant change was found Confirmed by Ezequiel Essex [phone]) on 04/09/2021 11:08:33 PM   Radiology DG Chest 2 View  Result Date: 04/08/2021 CLINICAL DATA:  Four days of intermittent chest pain. EXAM: CHEST - 2 VIEW COMPARISON:  January 10, 2021 FINDINGS: The heart size and mediastinal contours are within normal limits. No focal consolidation. No pleural effusion. No pneumothorax. The visualized skeletal structures are unremarkable. IMPRESSION: No active cardiopulmonary disease. Electronically Signed   By: Dahlia Bailiff MD   On: 04/08/2021 13:41     Procedures Procedures    ETT 3/15/202: No ischemia on stress ECG. Exercise time: 9 min 11 sec; Workload: 10.3 METS, normal exercise capacity; Test stopped due to: leg discomfort, patient request (he was going to have to start running); BP response: normal; HR response: normal; Rhythm: SR  Medications Ordered in ED Medications  sodium chloride 0.9 % bolus 1,000 mL (has no administration in time range)    ED Course  I have reviewed the triage vital signs and the nursing notes.  Pertinent labs & imaging results that were available during my care of the patient were reviewed by me and considered in my medical decision making (see chart for details).    MDM Rules/Calculators/A&P                         Fatigue with generalized weakness and near syncope.  Intermittent pinching sensation in his chest for the past several days. May be related to reduction in his usual caffeine intake.   Pain is atypical for ACS.  EKG is sinus rhythm with few PVCs.  Negative stress test in March.  Troponin negative.  D-dimer mildly elevated 0.65.  Chest x-ray obtained yesterday was negative.  Patient given IV fluids.  He was hydrated and orthostatics are negative.  CT shows no pulmonary embolism.  Does show tiny left apical pneumothorax.  Patient states he has a frequent cough in the smoke exposure but is not a smoker himself.  Pneumothorax was not seen on chest x-ray yesterday. This may explain his pinching discomfort in his chest.   Patient no respiratory distress.  O2 saturation 100% on room air. Troponin negative x2.  Low suspicion for ACS.  Occult pneumothorax discussed with Dr. Orvan Seen of thoracic surgery.  He agrees no indication for chest tube at this point.  Recommends repeat chest x-ray in 4 to 6 hours to ensure no worsening of his pneumothorax.  Chest xray at 630 AM shows no pneumothorax. Patient fells improved, ambulatory without dizziness, CP or headache.   Discussed followup with PCP.  Continue to limit caffeine intact. Discussed PTX should improve on its own  but he should return immediately to the ED with worsening chest pain, shortness of breath, or any other concerns.  SCOTLAND KORVER was evaluated in Emergency Department on 04/10/2021 for the symptoms described in the history of present illness. He was evaluated in the context of the global COVID-19 pandemic, which necessitated consideration that the patient might be at risk for infection with the SARS-CoV-2 virus that causes COVID-19. Institutional protocols and algorithms that pertain to the evaluation of patients at risk for COVID-19 are in a state of rapid change based on information released by regulatory bodies including the CDC and federal and state organizations. These policies and algorithms were followed during the patient's care in the ED.  Final Clinical Impression(s) / ED Diagnoses Final diagnoses:  Atypical chest pain  Pneumothorax, unspecified type    Rx / DC Orders ED Discharge Orders    None       Clerence Gubser, Annie Main, MD 04/10/21 [phone]

## 2021-04-10 ENCOUNTER — Emergency Department (HOSPITAL_BASED_OUTPATIENT_CLINIC_OR_DEPARTMENT_OTHER): Payer: 59

## 2021-04-10 ENCOUNTER — Emergency Department (HOSPITAL_BASED_OUTPATIENT_CLINIC_OR_DEPARTMENT_OTHER): Payer: 59 | Admitting: Radiology

## 2021-04-10 ENCOUNTER — Encounter (HOSPITAL_BASED_OUTPATIENT_CLINIC_OR_DEPARTMENT_OTHER): Payer: Self-pay | Admitting: Radiology

## 2021-04-10 LAB — TROPONIN I (HIGH SENSITIVITY): Troponin I (High Sensitivity): <2 ng/L (ref <18)

## 2021-04-10 LAB — URINALYSIS, ROUTINE W REFLEX MICROSCOPIC
Bilirubin Urine: NEGATIVE
Glucose, UA: NEGATIVE mg/dL
Hgb urine dipstick: NEGATIVE
Ketones, ur: NEGATIVE mg/dL
Leukocytes,Ua: NEGATIVE
Nitrite: NEGATIVE
Protein, ur: NEGATIVE mg/dL
Specific Gravity, Urine: <1.005 — ABNORMAL LOW (ref 1.005–1.030)
pH: 7 (ref 5.0–8.0)

## 2021-04-10 LAB — CK: Total CK: 141 U/L (ref 49–397)

## 2021-04-10 LAB — TSH: TSH: 3.271 u[IU]/mL (ref 0.350–4.500)

## 2021-04-10 LAB — RESP PANEL BY RT-PCR (FLU A&B, COVID) ARPGX2
Influenza A by PCR: NEGATIVE
Influenza B by PCR: NEGATIVE
SARS Coronavirus 2 by RT PCR: NEGATIVE

## 2021-04-10 LAB — D-DIMER, QUANTITATIVE: D-Dimer, Quant: 0.65 ug/mL-FEU — ABNORMAL HIGH (ref 0.00–0.50)

## 2021-04-10 MED ORDER — IOHEXOL 350 MG/ML SOLN
100.0000 mL | Freq: Once | INTRAVENOUS | Status: AC | PRN
  Administered 2021-04-10: 100 mL via INTRAVENOUS

## 2021-04-10 NOTE — Discharge Instructions (Signed)
There is no evidence of heart attack or blood clot in the lung. You do have a very small pneumothorax which should improve on its own. A thyroid test called TSH was ordered to evaluate your fatigue. This result is pending and should be available on MyChart later today. Followup with your primary doctor regarding this test as well as to have your symptoms rechecked. You should return to the ED immediately with worsening chest pain, difficulty breathing, or any other concerns.

## 2021-04-10 NOTE — ED Notes (Signed)
Pt discharged home after verbalizing understanding of discharge instructions; nad noted. 

## 2021-04-10 NOTE — Patient Instructions (Addendum)
We will call you within two weeks about your referral to GI. If you do not hear within 2 weeks, give [place] a call.   Health Maintenance Due  Topic Date Due  . COVID-19 Vaccine (3 - Booster for Pfizer series)-please send [place] the date so we can update 08/26/2020   Tdap today before you leave

## 2021-04-10 NOTE — Progress Notes (Signed)
Phone: [phone]   Subjective:  Patient presents today for their annual physical. Chief complaint-noted.   See problem oriented charting- ROS- full  review of systems was completed and negative  except for: post nasal drip, allergies, chest pain- see below  The following were reviewed and entered/updated in epic: Past Medical History:  Diagnosis Date  . ALLERGIC RHINITIS 08/29/2007  . BACK PAIN 01/08/2009  . GASTROENTERITIS, ACUTE 01/08/2009  . GERD 08/29/2007  . HEARING LOSS 05/10/2008  . 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 Shingles outbreak 06/24/2011  . SKIN LESION 10/28/2010   Patient Active Problem List   Diagnosis Date Noted  . Hypertriglyceridemia 10/06/2011    Priority: Medium  . Degenerative disc disease, lumbar 05/24/2019    Priority: Low  . Erectile dysfunction 07/31/2014    Priority: Low  . Allergic rhinitis 08/29/2007    Priority: Low  . GERD 08/29/2007    Priority: Low  . CYST, PILONIDAL W/ABSCESS 08/29/2007    Priority: Low  . Exertional chest pain 04/12/2020  . Onychomycosis 09/14/2008   Past Surgical History:  Procedure Laterality Date  . pilonidal cystectomy      Family History  Problem Relation Age of Onset  . Lung cancer Mother 48       Long-term smoker  . Coronary artery disease Father 7       Diagnosis 68-had CABG along with valve replacement;. 76 at death- MI complications. father was smoker  . Valvular heart disease Father        rheumatic fever- replaced  . Depression Other     Medications- reviewed and updated Current Outpatient Medications  Medication Sig Dispense Refill  . omeprazole (PRILOSEC) 40 MG capsule TAKE 1 CAPSULE (40 MG TOTAL) BY MOUTH DAILY. 90 capsule 1  . tadalafil (CIALIS) 20 MG tablet Take 1 tablet (20 mg total) by mouth every 3 (three) days. 10 tablet 11   No current facility-administered medications for this visit.    Allergies-reviewed and updated No Known Allergies  Social History   Social History  Narrative   Married 2008 with 5 kids (59, 66, 8, 5, 2 in 2022).    5th child 2019.       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   He also does walk on the treadmill.   Usually exercises about 40 minutes a day 3 days a week.  He also tries occasionally gets up and walks around the office throughout the day.   Objective  Objective:  BP 110/80 (BP Location: Left Arm, Patient Position: Sitting, Cuff Size: Large)   Pulse 77   Temp 97.9 F (36.6 C) (Temporal)   Ht 5' 9"  (1.753 m)   Wt 221 lb 8 oz (100.5 kg)   SpO2 96%   BMI 32.71 kg/m  Gen: NAD, resting comfortably HEENT: Mucous membranes are moist. Oropharynx normal Neck: no thyromegaly CV: RRR no murmurs rubs or gallops Lungs: CTAB no crackles, wheeze, rhonchi Abdomen: soft/nontender/nondistended/normal bowel sounds. No rebound or guarding.  Ext: no edema Skin: warm, dry Neuro: grossly normal, moves all extremities, PERRLA    Assessment and Plan  50 y.o. male presenting for annual physical.  Health Maintenance counseling: 1. Anticipatory guidance: Patient counseled regarding regular dental exams -q6 months,  eye exams -yearly,  avoiding smoking and second hand smoke , limiting alcohol to 2 beverages per day - 3 a week.   2. Risk factor reduction:  Advised patient of need for regular exercise and diet rich and fruits and vegetables to reduce risk of heart attack and stroke. Exercise- at the gym until this las tweek with chest pain issues- was doing 2-3 days a week. Diet- Weight down 4 lbs from last CPE. Wife cooking well for him .reducing red meat- to 1-2 days a week instead of 3-4.  Doing some smoothies with kale. Reducing junk food intake.  Wt Readings from Last 3 Encounters:  04/11/21 221 lb  8 oz (100.5 kg)  02/21/21 222 lb (100.7 kg)  01/20/21 225 lb 9.6 oz (102.3 kg)  3. Immunizations/screenings/ancillary studies- discussed 3rd covid vaccine - had in january Immunization History  Administered Date(s) Administered  . Influenza Split 09/20/2012  . Influenza,inj,Quad PF,6+ Mos 08/27/2017  . Influenza-Unspecified 09/16/2011  . PFIZER(Purple Top)SARS-COV-2 Vaccination 03/02/2020, 03/26/2020, 11/20/2020  . Td 08/16/2009  4. Prostate cancer screening- no family history, start at age 55  Lab Results  Component Value Date   PSA 0.83 08/23/2007   5. Colon cancer screening - will refer him today 6. Skin cancer screening- no dermatologist. advised regular sunscreen use. Denies worrisome, changing, or new skin lesions.  7. Never smoker 8. STD screening - monogmoous   Status of chronic or acute concerns   # GERD S:Medication: omeprazole 40 mg. Still with some occasional symptoms.   A/P: reasonable control- dont feel we can reduce with some breakthrough   #ED- cialis/viagra- seemed to work well for wife- on cialis most recently. Started taking cialis regularly. Also noting doing 4 cups of coffee a day. Also has done some red bulls or monster drinks. Sometimes was having triple shot vanilla latte as well. He has tried to reduce his caffeine intake as a result  #Chest pain Patient has had several episodes of chest pain and refecting back wondering if all of the above were triggers for him. Has had stress test which was low risk back in march. Last visit 02/21/21 and plan was to consider coronary calcium  Score if ongoing issues (has held off)And was doing ok for a while and then had resurgence of symptoms recently- 2 ER trips. Reports before pain had 4 cups of coffee that day and a 20 oz dr. 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  Later seen again on 04/10/21 (dizzy, chest pinches- some chills he wonders if could be from withdrawal from caffeine- has been trying to cut down after higher levels) and reassuring workup with ct angio after d dimer slightly positive- had 3 small pneumothoraces- they monitored and then repeated CXR with no clear pneumothorax- cardiothoracic surgery recommended no intervention, troponin negative.  -he has decided to come off of cialis -he is cutting down on coffee -he is cutting out redbull -no calcium on ct scan- dont think we need to pursue coronary calcium scoring  #lipids mildly elevated- 10 year ascvd risk under 3%- remain off medication for now. Update lipids next year  Recommended follow up: Return in about 1 year (around 04/11/2022) for physical or sooner if needed.   Lab/Order associations:NOT fasting   ICD-10-CM   1. Preventative health care  Z00.00   2. Encounter for screening colonoscopy  Z12.11  3. Need for prophylactic vaccination with combined diphtheria-tetanus-pertussis (DTP) vaccine  Z23     No orders of the defined types were placed in this encounter.   Return precautions advised.  [name], MD

## 2021-04-11 ENCOUNTER — Ambulatory Visit (INDEPENDENT_AMBULATORY_CARE_PROVIDER_SITE_OTHER): Payer: 59 | Admitting: Family Medicine

## 2021-04-11 ENCOUNTER — Encounter: Payer: Self-pay | Admitting: Family Medicine

## 2021-04-11 ENCOUNTER — Other Ambulatory Visit: Payer: Self-pay

## 2021-04-11 VITALS — BP 110/80 | HR 77 | Temp 97.9°F | Ht 69.0 in | Wt 221.5 lb

## 2021-04-11 DIAGNOSIS — Z0000 Encounter for general adult medical examination without abnormal findings: Secondary | ICD-10-CM

## 2021-04-11 DIAGNOSIS — Z1211 Encounter for screening for malignant neoplasm of colon: Secondary | ICD-10-CM | POA: Diagnosis not present

## 2021-04-11 DIAGNOSIS — Z23 Encounter for immunization: Secondary | ICD-10-CM | POA: Diagnosis not present

## 2021-04-11 DIAGNOSIS — N528 Other male erectile dysfunction: Secondary | ICD-10-CM | POA: Diagnosis not present

## 2021-04-11 DIAGNOSIS — K219 Gastro-esophageal reflux disease without esophagitis: Secondary | ICD-10-CM

## 2021-04-11 DIAGNOSIS — E781 Pure hyperglyceridemia: Secondary | ICD-10-CM

## 2021-04-15 ENCOUNTER — Other Ambulatory Visit: Payer: Self-pay

## 2021-04-15 DIAGNOSIS — E785 Hyperlipidemia, unspecified: Secondary | ICD-10-CM

## 2021-04-22 ENCOUNTER — Encounter: Payer: Self-pay | Admitting: Family Medicine

## 2021-04-23 NOTE — Telephone Encounter (Signed)
Nurse Assessment Nurse: Lenon Curt, RN, Melanie Date/Time (Eastern Time): 04/23/2021 8:06:08 AM Confirm and document reason for call. If symptomatic, describe symptoms. ---Feels light headed and lower legs are tingling and some nausea. ER a few weeks ago on heart and normal, c/o chest pains. This week on vacation, trouble sleeping. Yesterday after seafood dinner, got up and felt dizzy and legs were tingly. Walking made him feel better. Went to sleep. Woke up with same s/s plus little SOB (less energy to carry on a conversation) Just got over a stomach virus (Thursday and Friday). Does the patient have any new or worsening symptoms? ---Yes Will a triage be completed? ---Yes Related visit to physician within the last 2 weeks? ---Yes Does the PT have any chronic conditions? (i.e. diabetes, asthma, this includes High risk factors for pregnancy, etc.) ---No Is this a behavioral health or substance abuse call? ---No Guidelines Guideline Title Affirmed Question Affirmed Notes Nurse Date/Time (Eastern Time) Dizziness - Lightheadedness [1] MODERATE dizziness (e.g., interferes with normal activities) AND [2] has NOT Lenon Curt, RN, Melanie 04/23/2021 8:10:02 AM PLEASE NOTE: All timestamps contained within this report are represented as [place] Standard Time. CONFIDENTIALTY NOTICE: This fax transmission is intended only for the addressee. It contains information that is legally privileged, confidential or otherwise protected from use or disclosure. If you are not the intended recipient, you are strictly prohibited from reviewing, disclosing, copying using or disseminating any of this information or taking any action in reliance on or regarding this information. If you have received this fax in error, please notify us immediately by telephone so that we can arrange for its return to [place]. Phone: [phone], Toll-Free: [phone], Fax: [phone] Page: 2 of 3 Call Id: [id number] Guidelines Guideline  Title Affirmed Question Affirmed Notes Nurse Date/Time Eilene Ghazi Time) been evaluated by physician for this (Exception: dizziness caused by heat exposure, sudden standing, or poor fluid intake) Weakness (Generalized) and Fatigue Patient sounds very sick or weak to the triager Lenon Curt, RN, [facility] 04/23/2021 8:13:24 AM Disp. Time Eilene Ghazi Time) Disposition Final User 04/23/2021 8:13:03 AM See PCP within 24 Hours [place], RN, Threasa Beards 04/23/2021 8:17:23 AM Go to ED Now (or PCP triage) Yes Lenon Curt, RN, Donnajean Lopes Disagree/Comply Comply Caller Understands Yes PreDisposition Call Doctor Care Advice Given Per Guideline SEE PCP WITHIN 24 HOURS: * IF OFFICE WILL BE OPEN: You need to be examined within the next 24 hours. Call your doctor (or NP/PA) when the office opens and make an appointment. DRINK FLUIDS: CALL BACK IF: * Passes out (faints) * You become worse CARE ADVICE given per Dizziness (Adult) guideline. LIE DOWN AND REST: GO TO ED NOW (OR PCP TRIAGE): * IF NO PCP (PRIMARY CARE PROVIDER) SECOND-LEVEL TRIAGE: You need to be seen within the next hour. Go to the [facility] at _____________ [place] as soon as you can. ANOTHER ADULT SHOULD DRIVE: * It is better and safer if another adult drives instead of you. CARE ADVICE given per Weakness and Fatigue (Adult) guideline. Comments User: Erik Obey, RN Date/Time Eilene Ghazi Time): 04/23/2021 8:16:57 AM Coming back home tomorrow User: Erik Obey, RN Date/Time Eilene Ghazi Time): 04/23/2021 8:19:16 AM Will google "ER/UC near me" since they are out of town and don't know the area

## 2021-04-23 NOTE — Telephone Encounter (Signed)
Please call pt and schedule an appt with any provider for lightheadedness and tingling arms & legs.

## 2021-04-28 ENCOUNTER — Other Ambulatory Visit: Payer: Self-pay

## 2021-04-28 ENCOUNTER — Encounter: Payer: Self-pay | Admitting: Family Medicine

## 2021-04-28 ENCOUNTER — Ambulatory Visit: Payer: 59 | Admitting: Family Medicine

## 2021-04-28 ENCOUNTER — Other Ambulatory Visit (INDEPENDENT_AMBULATORY_CARE_PROVIDER_SITE_OTHER): Payer: 59

## 2021-04-28 VITALS — BP 129/83 | HR 78 | Temp 98.3°F | Ht 69.0 in | Wt 214.8 lb

## 2021-04-28 DIAGNOSIS — R5383 Other fatigue: Secondary | ICD-10-CM | POA: Diagnosis not present

## 2021-04-28 DIAGNOSIS — E785 Hyperlipidemia, unspecified: Secondary | ICD-10-CM | POA: Diagnosis not present

## 2021-04-28 DIAGNOSIS — R110 Nausea: Secondary | ICD-10-CM

## 2021-04-28 DIAGNOSIS — R202 Paresthesia of skin: Secondary | ICD-10-CM | POA: Diagnosis not present

## 2021-04-28 LAB — LIPID PANEL
Cholesterol: 138 mg/dL (ref 0–200)
HDL: 29.9 mg/dL — ABNORMAL LOW (ref >39.00)
NonHDL: 107.69
Total CHOL/HDL Ratio: 5
Triglycerides: 212 mg/dL — ABNORMAL HIGH (ref 0.0–149.0)
VLDL: 42.4 mg/dL — ABNORMAL HIGH (ref 0.0–40.0)

## 2021-04-28 LAB — COMPREHENSIVE METABOLIC PANEL
ALT: 32 U/L (ref 0–53)
AST: 27 U/L (ref 0–37)
Albumin: 4.3 g/dL (ref 3.5–5.2)
Alkaline Phosphatase: 101 U/L (ref 39–117)
BUN: 9 mg/dL (ref 6–23)
CO2: 27 mEq/L (ref 19–32)
Calcium: 9 mg/dL (ref 8.4–10.5)
Chloride: 104 mEq/L (ref 96–112)
Creatinine, Ser: 1 mg/dL (ref 0.40–1.50)
GFR: 88.88 mL/min (ref >60.00)
Glucose, Bld: 89 mg/dL (ref 70–99)
Potassium: 4.4 mEq/L (ref 3.5–5.1)
Sodium: 138 mEq/L (ref 135–145)
Total Bilirubin: 0.8 mg/dL (ref 0.2–1.2)
Total Protein: 7.3 g/dL (ref 6.0–8.3)

## 2021-04-28 LAB — CBC WITH DIFFERENTIAL/PLATELET
Basophils Absolute: 0 10*3/uL (ref 0.0–0.1)
Basophils Relative: 0.6 % (ref 0.0–3.0)
Eosinophils Absolute: 0.2 10*3/uL (ref 0.0–0.7)
Eosinophils Relative: 3.4 % (ref 0.0–5.0)
HCT: 48.1 % (ref 39.0–52.0)
Hemoglobin: 16.1 g/dL (ref 13.0–17.0)
Lymphocytes Relative: 27.8 % (ref 12.0–46.0)
Lymphs Abs: 1.8 10*3/uL (ref 0.7–4.0)
MCHC: 33.6 g/dL (ref 30.0–36.0)
MCV: 89 fl (ref 78.0–100.0)
Monocytes Absolute: 0.6 10*3/uL (ref 0.1–1.0)
Monocytes Relative: 8.5 % (ref 3.0–12.0)
Neutro Abs: 3.9 10*3/uL (ref 1.4–7.7)
Neutrophils Relative %: 59.7 % (ref 43.0–77.0)
Platelets: 247 10*3/uL (ref 150.0–400.0)
RBC: 5.41 Mil/uL (ref 4.22–5.81)
RDW: 13.5 % (ref 11.5–15.5)
WBC: 6.5 10*3/uL (ref 4.0–10.5)

## 2021-04-28 LAB — LDL CHOLESTEROL, DIRECT: Direct LDL: 85 mg/dL

## 2021-04-28 LAB — TSH: TSH: 1.96 u[IU]/mL (ref 0.35–4.50)

## 2021-04-28 MED ORDER — ALPRAZOLAM 0.5 MG PO TABS: 0.5000 mg | ORAL_TABLET | Freq: Every day | ORAL | 0 refills | Status: DC | PRN

## 2021-04-28 NOTE — Patient Instructions (Addendum)
Please schedule a follow up visit with Dr. Yong Channel.   We will call you to check the nerves in your legs.   Read about panic and anxiety to see if this could be related to some of your symptoms.   You may try the xanax to see if it helps.  Your lab work from today all is normal. Your cholesterol levels are excellent.   [email or web address]">  Panic Attack A panic attack is a sudden episode of severe anxiety, fear, or discomfort that causes physical and emotional symptoms. The attack may be in response tosomething frightening, or it may occur for no known reason. Symptoms of a panic attack can be similar to symptoms of a heart attack or stroke. It is important to see your health care provider when you have a panicattack so that these conditions can be ruled out. A panic attack is a symptom of another condition. Most panic attacks go away with treatment of the underlying problem. If you have panic attacks often, youmay have a condition called panic disorder. What are the causes? A panic attack may be caused by: An extreme, life-threatening situation, such as a war or natural disaster. An anxiety disorder, such as post-traumatic stress disorder. Depression. Certain medical conditions, including heart problems, neurological conditions, and infections. Certain over-the-counter and prescription medicines. Illegal drugs that increase heart rate and blood pressure, such as methamphetamine. Alcohol. Supplements that increase anxiety. Panic disorder. What increases the risk? You are more likely to develop this condition if: You have an anxiety disorder. You have another mental health condition. You take certain medicines. You use alcohol, illegal drugs, or other substances. You are under extreme stress. A life event is causing increased feelings of anxiety and depression. What are the signs or symptoms? A panic attack starts suddenly, usually  lasts about 20 minutes, and occurs with one or more of the following: A pounding heart. A feeling that your heart is beating irregularly or faster than normal (palpitations). Sweating. Trembling or shaking. Shortness of breath or feeling smothered. Feeling choked. Chest pain or discomfort. Nausea or a strange feeling in your stomach. Dizziness, feeling lightheaded, or feeling like you might faint. Chills or hot flashes. Numbness or tingling in your lips, hands, or feet. Feeling confused, or feeling that you are not yourself. Fear of losing control or being emotionally unstable. Fear of dying. How is this diagnosed? A panic attack is diagnosed with an assessment by your health care provider. During the assessment your health care provider will ask questions about: Your history of anxiety, depression, and panic attacks. Your medical history. Whether you drink alcohol, use illegal drugs, take supplements, or take medicines. Be honest about your substance use. Your health care provider may also: Order blood tests or other kinds of tests to rule out serious medical conditions. Refer you to a mental health professional for further evaluation. How is this treated? Treatment depends on the cause of the panic attack: If the cause is a medical problem, your health care provider will either treat that problem or refer you to a specialist. If the cause is emotional, you may be given anti-anxiety medicines or referred to a counselor. These medicines may reduce how often attacks happen, reduce how severe the attacks are, and lower anxiety. If the cause is a medicine, your health care provider may tell you to stop the medicine, change your dose, or take a different medicine. If the cause is a drug, treatment may involve letting the drug wear off and taking  medicine to help the drug leave your body or to counteract its effects. Attacks caused by drug abuse may continue even if you stop using the  drug. Follow these instructions at home: Take over-the-counter and prescription medicines only as told by your health care provider. If you feel anxious, limit your caffeine intake. Take good care of your physical and mental health by: Eating a balanced diet that includes plenty of fresh fruits and vegetables, whole grains, lean meats, and low-fat dairy. Getting plenty of rest. Try to get 7-8 hours of uninterrupted sleep each night. Exercising regularly. Try to get 30 minutes of physical activity at least 5 days a week. Not smoking. Talk to your health care provider if you need help quitting. Limiting alcohol intake to no more than 1 drink a day for nonpregnant women and 2 drinks a day for men. One drink equals 12 oz of beer, 5 oz of wine, or 1 oz of hard liquor. Keep all follow-up visits as told by your health care provider. This is important. Panic attacks may have underlying physical or emotional problems that take time to accurately diagnose. Contact a health care provider if: Your symptoms do not improve, or they get worse. You are not able to take your medicine as prescribed because of side effects. Get help right away if: You have serious thoughts about hurting yourself or others. You have symptoms of a panic attack. Do not drive yourself to the hospital. Have someone else drive you or call an ambulance. If you ever feel like you may hurt yourself or others, or you have thoughts about taking your own life, get help right away. You can go to your nearest emergency department or call: Your local emergency services (911 in the [place] A suicide crisis helpline, such as the [facility] at (859)869-8123. This is open 24 hours a day. Summary A panic attack is a sign of a serious health or mental health condition. Get help right away. Do not drive yourself to the hospital. Have someone else drive you or call an ambulance. Always see a health care provider to have the  reasons for the panic attack correctly diagnosed. If your panic attack was caused by a physical problem, follow your health care provider's suggestions for medicine, referral to a specialist, and lifestyle changes. If your panic attack was caused by an emotional problem, follow through with counseling from a qualified mental health specialist. If you feel like you may hurt yourself or others, call 911 and get help right away. This information is not intended to replace advice given to you by your health care provider. Make sure you discuss any questions you have with your healthcare provider. Document Revised: 05/02/2020 Document Reviewed: 05/02/2020 Elsevier Patient Education  [place].

## 2021-04-28 NOTE — Progress Notes (Signed)
Same day acute visit; PCP not available. New pt to me. Chart reviewed.   Subjective  CC:  Chief Complaint  Patient presents with   Numbness    Radiates in his lower legs, and he's having the numbness in his arms too.    Nausea    Patient states that he feels like he's going to "die" patient has been seen at the medical center and everything with his heart came back normal. He's still concerned.    Fatigue    Decreased energy.    Same day acute visit; PCP not available. New pt to me. Chart reviewed.    HPI: Tanner [name] is a 50 y.o. male who presents to the office today to address the problems listed above in the chief complaint. 50 year old male without significant risk factors for cardiovascular disease, recent complete physical with normal lab work and negative cardiac stress testing and cardiology evaluation earlier this year presents due to persistent and progressive symptoms: He is worried that he is going to have a heart attack or "stroke out".  He reports over the last weeks, since his last urgent care evaluation for atypical chest pain, he is experiencing numbness and tingling in his legs and hands, intermittent nausea, intermittent lightheadedness and fatigue.  He will have good days and bad days.  At times he has had palpitations and sweats.  His PCP ordered lab work for today.  I reviewed those lab results and they are noted below.  He is extremely worried about his elevated triglycerides at 218.  He denies diplopia, dysarthria or paresis.  He works out at [employer] regularly and does well there.  He is concerned some of his symptoms may related to caffeine use or sildenafil.  I reviewed multiple notes, records and lab work.  He denies problems with mood or anxiety.  He has no history of panic attacks.  He admits to being a chronic worrier, mainly financial.  He is a father of 5 children.  He does not feel like his stressors are any worse than usual. Review of systems negative for  fevers, chills, cough, myalgias, arthralgias, paresis.  No rashes or leg swelling.   Assessment  1. Paresthesia of both lower extremities   2. Nausea   3. Fatigue, unspecified type      Plan  Symptom complex could be consistent with anxiety or panic attacks differential diagnosis includes peripheral neuropathy or other causes.:  Reassured, unlikely to be cardiac related.  He does not carry risk factors for early CVA.  His symptoms are not consistent with CVA.  Recommend adding some lab work listed below, nerve conduction studies to rule out peripheral neuropathy and follow-up with PCP.  Trial of low-dose Xanax to see if that improves any of his symptoms. Today's visit was 43 minutes long. Greater than 50% of this time was devoted to face to face counseling with the patient and coordination of care. We discussed his diagnosis, prognosis, treatment options and treatment plan is documented above.   Orders Placed This Encounter  Procedures   TSH   B12 and Folate Panel   VITAMIN D 25 Hydroxy (Vit-D Deficiency, Fractures)   NCV with EMG(electromyography)   Meds ordered this encounter  Medications   ALPRAZolam (XANAX) 0.5 MG tablet    Sig: Take 1 tablet (0.5 mg total) by mouth daily as needed for anxiety.    Dispense:  20 tablet    Refill:  0      I reviewed the  patients updated PMH, FH, and SocHx.    Patient Active Problem List   Diagnosis Date Noted   Exertional chest pain 04/12/2020   Degenerative disc disease, lumbar 05/24/2019   Erectile dysfunction 07/31/2014   Hypertriglyceridemia 10/06/2011   Onychomycosis 09/14/2008   Allergic rhinitis 08/29/2007   GERD 08/29/2007   CYST, PILONIDAL W/ABSCESS 08/29/2007   Current Meds  Medication Sig   ALPRAZolam (XANAX) 0.5 MG tablet Take 1 tablet (0.5 mg total) by mouth daily as needed for anxiety.   Fexofenadine HCl (ALLEGRA ALLERGY PO) Take by mouth.    Allergies: Patient has No Known Allergies. Family History: Patient family  history includes Coronary artery disease (age of onset: 86) in his father; Depression in an other family member; Lung cancer (age of onset: 56) in his mother; Valvular heart disease in his father. Social History:  Patient  reports that he has never smoked. He has never used smokeless tobacco. He reports current alcohol use of about 6.0 standard drinks of alcohol per week. He reports that he does not use drugs.  Review of Systems: Constitutional: Negative for fever malaise or anorexia Cardiovascular: negative for chest pain Respiratory: negative for SOB or persistent cough Gastrointestinal: negative for abdominal pain  Objective  Vitals: BP 129/83   Pulse 78   Temp 98.3 F (36.8 C) (Temporal)   Ht 5' 9"  (1.753 m)   Wt 214 lb 12.8 oz (97.4 kg)   SpO2 98%   BMI 31.72 kg/m  General: no acute distress , A&Ox3 Psych: Anxious affect, well-groomed HEENT: PEERL, conjunctiva normal, neck is supple Cardiovascular:  RRR without murmur or gallop.  Respiratory:  Good breath sounds bilaterally, CTAB with normal respiratory effort Skin:  Warm, no rashes No calf edema or calf tenderness  Lab on 04/28/2021  Component Date Value Ref Range Status   Cholesterol 04/28/2021 138  0 - 200 mg/dL Final   Triglycerides 04/28/2021 212.0 (A) 0.0 - 149.0 mg/dL Final   HDL 04/28/2021 29.90 (A) >39.00 mg/dL Final   VLDL 04/28/2021 42.4 (A) 0.0 - 40.0 mg/dL Final   Total CHOL/HDL Ratio 04/28/2021 5   Final   NonHDL 04/28/2021 107.69   Final   WBC 04/28/2021 6.5  4.0 - 10.5 K/uL Final   RBC 04/28/2021 5.41  4.22 - 5.81 Mil/uL Final   Hemoglobin 04/28/2021 16.1  13.0 - 17.0 g/dL Final   HCT 04/28/2021 48.1  39.0 - 52.0 % Final   MCV 04/28/2021 89.0  78.0 - 100.0 fl Final   MCHC 04/28/2021 33.6  30.0 - 36.0 g/dL Final   RDW 04/28/2021 13.5  11.5 - 15.5 % Final   Platelets 04/28/2021 247.0  150.0 - 400.0 K/uL Final   Neutrophils Relative % 04/28/2021 59.7  43.0 - 77.0 % Final   Lymphocytes Relative 04/28/2021  27.8  12.0 - 46.0 % Final   Monocytes Relative 04/28/2021 8.5  3.0 - 12.0 % Final   Eosinophils Relative 04/28/2021 3.4  0.0 - 5.0 % Final   Basophils Relative 04/28/2021 0.6  0.0 - 3.0 % Final   Neutro Abs 04/28/2021 3.9  1.4 - 7.7 K/uL Final   Lymphs Abs 04/28/2021 1.8  0.7 - 4.0 K/uL Final   Monocytes Absolute 04/28/2021 0.6  0.1 - 1.0 K/uL Final   Eosinophils Absolute 04/28/2021 0.2  0.0 - 0.7 K/uL Final   Basophils Absolute 04/28/2021 0.0  0.0 - 0.1 K/uL Final   Sodium 04/28/2021 138  135 - 145 mEq/L Final   Potassium 04/28/2021 4.4  3.5 -  5.1 mEq/L Final   Chloride 04/28/2021 104  96 - 112 mEq/L Final   CO2 04/28/2021 27  19 - 32 mEq/L Final   Glucose, Bld 04/28/2021 89  70 - 99 mg/dL Final   BUN 04/28/2021 9  6 - 23 mg/dL Final   Creatinine, Ser 04/28/2021 1.00  0.40 - 1.50 mg/dL Final   Total Bilirubin 04/28/2021 0.8  0.2 - 1.2 mg/dL Final   Alkaline Phosphatase 04/28/2021 101  39 - 117 U/L Final   AST 04/28/2021 27  0 - 37 U/L Final   ALT 04/28/2021 32  0 - 53 U/L Final   Total Protein 04/28/2021 7.3  6.0 - 8.3 g/dL Final   Albumin 04/28/2021 4.3  3.5 - 5.2 g/dL Final   GFR 04/28/2021 88.88  >60.00 mL/min Final   Calcium 04/28/2021 9.0  8.4 - 10.5 mg/dL Final   Direct LDL 04/28/2021 85.0  mg/dL Final     Commons side effects, risks, benefits, and alternatives for medications and treatment plan prescribed today were discussed, and the patient expressed understanding of the given instructions. Patient is instructed to call or message via MyChart if he/she has any questions or concerns regarding our treatment plan. No barriers to understanding were identified. We discussed Red Flag symptoms and signs in detail. Patient expressed understanding regarding what to do in case of urgent or emergency type symptoms.  Medication list was reconciled, printed and provided to the patient in AVS. Patient instructions and summary information was reviewed with the patient as documented in the  AVS. This note was prepared with assistance of Dragon voice recognition software. Occasional wrong-word or sound-a-like substitutions may have occurred due to the inherent limitations of voice recognition software  This visit occurred during the SARS-CoV-2 public health emergency.  Safety protocols were in place, including screening questions prior to the visit, additional usage of staff PPE, and extensive cleaning of exam room while observing appropriate contact time as indicated for disinfecting solutions.

## 2021-04-29 LAB — B12 AND FOLATE PANEL
Folate: 21.9 ng/mL (ref >5.9)
Vitamin B-12: 363 pg/mL (ref 211–911)

## 2021-04-29 LAB — VITAMIN D 25 HYDROXY (VIT D DEFICIENCY, FRACTURES): VITD: 38.85 ng/mL (ref 30.00–100.00)

## 2021-04-30 ENCOUNTER — Encounter: Payer: Self-pay | Admitting: Family Medicine

## 2021-04-30 NOTE — Telephone Encounter (Signed)
Dr. Jonni Sanger, please see message from pt. He saw you on 6/13.

## 2021-05-07 ENCOUNTER — Other Ambulatory Visit: Payer: Self-pay

## 2021-05-07 ENCOUNTER — Encounter: Payer: Self-pay | Admitting: Family Medicine

## 2021-05-07 ENCOUNTER — Ambulatory Visit: Payer: 59 | Admitting: Family Medicine

## 2021-05-07 VITALS — BP 120/84 | HR 78 | Temp 98.2°F | Ht 69.0 in | Wt 215.6 lb

## 2021-05-07 DIAGNOSIS — R202 Paresthesia of skin: Secondary | ICD-10-CM | POA: Diagnosis not present

## 2021-05-07 NOTE — Patient Instructions (Addendum)
Health Maintenance Due  Topic Date Due   COVID-19 Vaccine (4 - Booster for [employer] series) Patient will schedule in the fall.  02/23/2021   Lets get nerve conduction study then go from there  If new or worsening symptoms please let me know  Recommended follow up: Return for as needed for new, worsening, persistent symptoms. Physical each may- can go ahead and schedule next years

## 2021-05-07 NOTE — Progress Notes (Signed)
Phone [phone] In person visit   Subjective:   Tanner [name] is a 50 y.o. year old very pleasant male patient who presents for/with See problem oriented charting Chief Complaint  Patient presents with   Numbness    Bilateral leg numbness, balance issues sometimes in terms of his head , Patient states that when the numbness comes he feels very tired and fatigued     This visit occurred during the SARS-CoV-2 public health emergency.  Safety protocols were in place, including screening questions prior to the visit, additional usage of staff PPE, and extensive cleaning of exam room while observing appropriate contact time as indicated for disinfecting solutions.   Past Medical History-  Patient Active Problem List   Diagnosis Date Noted   Hypertriglyceridemia 10/06/2011    Priority: Medium   Degenerative disc disease, lumbar 05/24/2019    Priority: Low   Erectile dysfunction 07/31/2014    Priority: Low   Allergic rhinitis 08/29/2007    Priority: Low   GERD 08/29/2007    Priority: Low   CYST, PILONIDAL W/ABSCESS 08/29/2007    Priority: Low   Exertional chest pain 04/12/2020   Onychomycosis 09/14/2008    Medications- reviewed and updated Current Outpatient Medications  Medication Sig Dispense Refill   Cyanocobalamin (VITAMIN B-12 PO) Take 2,500 mg by mouth.     Fexofenadine HCl (ALLEGRA ALLERGY PO) Take by mouth.     No current facility-administered medications for this visit.     Objective:  BP 120/84   Pulse 78   Temp 98.2 F (36.8 C) (Temporal)   Ht 5' 9"  (1.753 m)   Wt 215 lb 9.6 oz (97.8 kg)   SpO2 96%   BMI 31.84 kg/m  Gen: NAD, resting comfortably CV: RRR no murmurs rubs or gallops Lungs: CTAB no crackles, wheeze, rhonchi Ext: no edema Skin: warm, dry  Neuro: CN II-XII intact, sensation and reflexes normal throughout, 5/5 muscle strength in bilateral upper and lower extremities. Normal finger to nose. Normal rapid alternating movements. No pronator  drift. Normal romberg. Normal gait.      Assessment and Plan   #Intermittent paresthesias of bilateral lower extremities associated with fatigue and imbalance sensation S: Patient presented on 04/28/2021 with numbness and tingling in his legs and hands, intermittent nausea, intermittent lightheadedness and fatigue.  Was having some good days and bad days.  Had palpitations and sweats at times as well.  No diplopia dysarthria or paresis.  He was doing well at the gym at that time.  He was concerned issues could be related to caffeine or sildenafil.  No history of panic attacks or mood or anxiety issues but patient did report being a chronic worrier-primarily financial with 5 children but did not feel like stressors were worse than normal.  There was some concern issues could be related to anxiety or panic attacks with differential including peripheral neuropathy or other causes.  Unlikely cardiac.  Minimal risk factors for early CVA and symptoms were not consistent with CVA.  Plan was for nerve conduction studies to rule out peripheral neuropathy and trial low-dose Xanax to see if that helps with symptoms.  Vitamin D reassuring, B12 and folate reassuring (though B12 under 400 and patient was started on B12 supplementation), TSH normal.  LDL reasonable for his age at 69.  CMP and CBC normal.  Triglycerides minimally elevated at 212.Nerve conduction study ordered but not yet scheduled  Today patient reports, he can be standing around and legs suddenly go numb- if touches  them feels desensitized. Also gets overly tired feeling when this happens and also feels imbalanced overall. No vertigo. Feels like legs are heavy. Usually lower legs- more rarealy mild feeling in fingertips  Seems like salt intake may have positive impact- chips were helpful or salty foods last night. Reports not having palpitations lately.  Not having back pain  Tried some allegra to see if would help with balance issues but has not  noted any difference in symptoms.  Does not acutely feel dizzy or lightheaded in the ears or head  Did start low dose b12 and has noted some improvement in fatigue symptoms. Happening every other day or so still with numbness- can be mild then worse can last most of day though  A/P: 50 year old male with extensive cardiac work-up earlier this year which was reassuring now presenting with intermittent paresthesias over the last few weeks associated with sensation of fatigue and imbalance.  Extensive work-up from Dr. Jonni Sanger so far unrevealing other than low normal B12-he was started on B12 supplement and started about 4 days ago-symptoms may be slightly less prominent at least with the fatigue-encouraged him to continue this.  He is doing 2000 100 mg a day-I think taking this every other day is reasonable.  We were able to get his nerve conduction study scheduled but this was in September-we change this to urgent and were able to get scheduled for June 28.  We discussed possibility of something like MS but that is not high on the differential.  Reassuring neurological exam today and I would suspect symptoms would not come and go as easily with MS-consider MRI of brain or lumbar spine if worsening symptoms    Recommended follow up: Return for as needed for new, worsening, persistent symptoms.  Also recommended at least an annual physical Future Appointments  Date Time Provider [place]  05/13/2021  2:30 PM Penumalli, Earlean Polka, MD [id number] None   Lab/Order associations:   ICD-10-CM   1. Paresthesia of both lower extremities  R20.2      Time Spent: 41 minutes of total time (11:47 PM- 12:28 PM) was spent on the date of the encounter performing the following actions: chart review prior to seeing the patient, obtaining history, performing a medically necessary exam, counseling on B12 and follow-up steps , placing orders-went back into prior orders to change to stat, and documenting in our EHR.  This  has been a particular stressful time recently for patient and needed some extended counseling which is understandable   Return precautions advised.  [name], MD

## 2021-05-07 NOTE — Addendum Note (Signed)
Addended by: [name] on: 05/07/2021 12:13 PM   Modules accepted: Orders

## 2021-05-10 ENCOUNTER — Encounter: Payer: Self-pay | Admitting: Family Medicine

## 2021-05-13 ENCOUNTER — Other Ambulatory Visit: Payer: Self-pay

## 2021-05-13 ENCOUNTER — Encounter: Payer: Self-pay | Admitting: Diagnostic Neuroimaging

## 2021-05-13 ENCOUNTER — Ambulatory Visit: Payer: 59 | Admitting: Diagnostic Neuroimaging

## 2021-05-13 ENCOUNTER — Telehealth: Payer: Self-pay | Admitting: Diagnostic Neuroimaging

## 2021-05-13 VITALS — BP 115/69 | HR 76 | Ht 68.0 in | Wt 218.0 lb

## 2021-05-13 DIAGNOSIS — R202 Paresthesia of skin: Secondary | ICD-10-CM

## 2021-05-13 DIAGNOSIS — R200 Anesthesia of skin: Secondary | ICD-10-CM

## 2021-05-13 DIAGNOSIS — R2689 Other abnormalities of gait and mobility: Secondary | ICD-10-CM | POA: Diagnosis not present

## 2021-05-13 NOTE — Progress Notes (Signed)
[facility]  PATIENT: Tanner [name] DOB: 1971/08/23  REFERRING CLINICIAN: [name], MD HISTORY FROM: patient  REASON FOR VISIT: new consult    HISTORICAL  CHIEF COMPLAINT:  Chief Complaint  Patient presents with   Bilateral paresthesia of LE    Rm 7 New Pt  "began in May with numbing from knees down, random little shocks in body, some tightness in my stomach with nausea, lightheadedness"    HISTORY OF PRESENT ILLNESS:   50 year old male with with intermittent episodes of numbness, nausea, fatigue and brain fog.  Symptoms have been going on for past 1 month.  Symptoms occurring every few days.  No recent triggering or aggravating factors.  Past few years patient also having intermittent episodes of nausea, lightheadedness, shortness of breath, chest pain.  Patient has been to PCP, cardiology, ER multiple times for evaluation.    REVIEW OF SYSTEMS: Full 14 system review of systems performed and negative with exception of: as per HPI.  ALLERGIES: No Known Allergies  HOME MEDICATIONS: Outpatient Medications Prior to Visit  Medication Sig Dispense Refill   Cyanocobalamin (VITAMIN B-12 PO) Take 2,500 mg by mouth.     Fexofenadine HCl (ALLEGRA ALLERGY PO) Take by mouth.     Multiple Vitamin (MULTIVITAMIN) capsule Take 1 capsule by mouth daily.     No facility-administered medications prior to visit.    PAST MEDICAL HISTORY: Past Medical History:  Diagnosis Date   ALLERGIC RHINITIS 08/29/2007   BACK PAIN 01/08/2009   GASTROENTERITIS, ACUTE 01/08/2009   GERD 08/29/2007   HEARING LOSS 05/10/2008   ONYCHOMYCOSIS, TOENAILS 09/14/2008   Shingles outbreak 06/24/2011   SKIN LESION 10/28/2010    PAST SURGICAL HISTORY: Past Surgical History:  Procedure Laterality Date   pilonidal cystectomy      FAMILY HISTORY: Family History  Problem Relation Age of Onset   Lung cancer Mother 54       Long-term smoker   Coronary artery disease Father 49        Diagnosis 68-had CABG along with valve replacement;. 27 at death- MI complications. 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    5th child 2019.    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   He also does walk on the treadmill.   Usually exercises about 40 minutes a day 3 days a week.  He also tries occasionally gets up and walks around the office throughout the day.   Social Determinants of Health   Financial Resource Strain: Not on file  Food Insecurity: Not on file  Transportation Needs: Not on file  Physical Activity: Not on file  Stress: Not on file  Social Connections: Not on file  Intimate Partner Violence: Not on file     PHYSICAL EXAM  GENERAL EXAM/CONSTITUTIONAL: Vitals:  Vitals:   05/13/21 1413  BP: 115/69  Pulse: 76  Weight: 218 lb (98.9 kg)  Height: 5' 8"  (1.727 m)    Body mass index is 33.15 kg/m. Wt Readings from Last 3 Encounters:  05/13/21 218 lb (98.9 kg)  05/07/21 215 lb 9.6 oz (97.8 kg)  04/28/21 214 lb 12.8 oz (97.4 kg)   Patient is in no distress; well developed, nourished and groomed; neck is supple  CARDIOVASCULAR: Examination of carotid arteries is normal; no carotid bruits Regular rate and rhythm, no murmurs Examination of peripheral vascular system by observation and palpation is normal  EYES: Ophthalmoscopic exam of optic discs and posterior segments is normal; no papilledema or hemorrhages No results found.  MUSCULOSKELETAL: Gait, strength, tone, movements noted in Neurologic exam below  NEUROLOGIC: MENTAL STATUS:  No flowsheet data found. awake, alert, oriented to person, place and time recent and remote memory intact normal attention and concentration language fluent, comprehension intact, naming intact fund of knowledge appropriate  CRANIAL NERVE:  2nd - no papilledema on fundoscopic exam 2nd, 3rd, 4th, 6th - pupils equal and reactive to light, visual fields full to confrontation, extraocular muscles intact, no nystagmus 5th - facial sensation symmetric 7th - facial strength symmetric 8th - hearing intact 9th - palate elevates symmetrically, uvula midline 11th - shoulder shrug symmetric 12th - tongue protrusion midline  MOTOR:  normal bulk and tone, full strength in the BUE, BLE  SENSORY:  normal and symmetric to light touch, temperature, vibration  COORDINATION:  finger-nose-finger, fine finger movements normal  REFLEXES:  deep tendon reflexes present and symmetric  GAIT/STATION:  narrow based gait     DIAGNOSTIC DATA (LABS, IMAGING, TESTING) - I reviewed patient records, labs, notes, testing and imaging myself where available.  Lab Results  Component Value Date   WBC 6.5 04/28/2021   HGB 16.1 04/28/2021   HCT 48.1 04/28/2021   MCV 89.0 04/28/2021   PLT 247.0 04/28/2021      Component  Value Date/Time   NA 138 04/28/2021 0803   K 4.4 04/28/2021 0803   CL 104 04/28/2021 0803   CO2 27 04/28/2021 0803   GLUCOSE 89 04/28/2021 0803   BUN 9 04/28/2021 0803   CREATININE 1.00 04/28/2021 0803   CALCIUM 9.0 04/28/2021 0803   PROT 7.3 04/28/2021 0803   ALBUMIN 4.3 04/28/2021 0803   AST 27 04/28/2021 0803   ALT 32 04/28/2021 0803   ALKPHOS 101 04/28/2021 0803   BILITOT 0.8 04/28/2021 0803   GFRNONAA >60 04/09/2021 2305   GFRAA >60 05/17/2015 1354   Lab Results  Component Value Date   CHOL 138 04/28/2021   HDL 29.90 (L) 04/28/2021   LDLCALC 94 04/12/2020   LDLDIRECT 85.0 04/28/2021   TRIG 212.0 (H) 04/28/2021   CHOLHDL 5 04/28/2021   No results found for: HGBA1C Lab Results  Component  Value Date   VITAMINB12 363 04/28/2021   Lab Results  Component Value Date   TSH 1.96 04/28/2021    Lipid Panel     Component Value Date/Time   CHOL 138 04/28/2021 0803   TRIG 212.0 (H) 04/28/2021 0803   HDL 29.90 (L) 04/28/2021 0803   CHOLHDL 5 04/28/2021 0803   VLDL 42.4 (H) 04/28/2021 0803   LDLCALC 94 04/12/2020 1008   LDLDIRECT 85.0 04/28/2021 0803     ASSESSMENT AND PLAN  50 y.o. year old male here with abnormal sensations of nausea, lightheadedness, fatigue, brain fog, balance difficulty, numbness and tingling in the calves and legs, low back pain.   Dx:  1. Numbness and tingling   2. Balance problem       PLAN:  NAUSEA / BRAIN FOG / LIGHTHEADEDNESS / DIZZINESS - check MRI brain (rule out MS)  BACK PAIN / NUMBNESS IN LEGS - check MRI lumbar spine (rule out spinal stenosis)  Orders Placed This Encounter  Procedures   MR BRAIN W WO CONTRAST   MR [place]   Return for pending if symptoms worsen or fail to improve, pending test results.    Penni Bombard, MD 12/01/5788, 3:83 PM Certified in Neurology, Neurophysiology and Neuroimaging  [facility] [address] [place], [place] [address] [phone]

## 2021-05-13 NOTE — Telephone Encounter (Addendum)
MRI brain w/wo contrast U[facility]: [place] ref # [id number]  MRI lumbar spine wo contrast UHC: [place] ref # [id number]  Scheduled at [facility] 05/21/21 at 8:00 am

## 2021-05-21 ENCOUNTER — Ambulatory Visit: Payer: 59

## 2021-05-21 DIAGNOSIS — R202 Paresthesia of skin: Secondary | ICD-10-CM | POA: Diagnosis not present

## 2021-05-21 DIAGNOSIS — R2689 Other abnormalities of gait and mobility: Secondary | ICD-10-CM | POA: Diagnosis not present

## 2021-05-21 DIAGNOSIS — R200 Anesthesia of skin: Secondary | ICD-10-CM | POA: Diagnosis not present

## 2021-05-21 MED ORDER — GADOBENATE DIMEGLUMINE 529 MG/ML IV SOLN
20.0000 mL | Freq: Once | INTRAVENOUS | Status: AC | PRN
  Administered 2021-05-21: 20 mL via INTRAVENOUS

## 2021-05-27 NOTE — Telephone Encounter (Signed)
Just following up on this. Pt would like to have results/recommendation. Thanks!

## 2021-05-28 ENCOUNTER — Other Ambulatory Visit: Payer: Self-pay

## 2021-05-28 DIAGNOSIS — M5136 Other intervertebral disc degeneration, lumbar region: Secondary | ICD-10-CM

## 2021-05-28 DIAGNOSIS — M5126 Other intervertebral disc displacement, lumbar region: Secondary | ICD-10-CM

## 2021-06-12 ENCOUNTER — Encounter: Payer: Self-pay | Admitting: Family Medicine

## 2021-06-16 ENCOUNTER — Encounter: Payer: Self-pay | Admitting: Family Medicine

## 2021-06-17 NOTE — Telephone Encounter (Signed)
Please advise 

## 2021-06-18 ENCOUNTER — Ambulatory Visit (INDEPENDENT_AMBULATORY_CARE_PROVIDER_SITE_OTHER): Payer: 59 | Admitting: Physical Therapy

## 2021-06-18 ENCOUNTER — Other Ambulatory Visit: Payer: Self-pay

## 2021-06-18 DIAGNOSIS — M6281 Muscle weakness (generalized): Secondary | ICD-10-CM

## 2021-06-20 ENCOUNTER — Encounter: Payer: Self-pay | Admitting: Physical Therapy

## 2021-06-20 NOTE — Therapy (Signed)
[facility] [address] [place], [place], [address] Phone: [phone]   Fax:  [phone]  Physical Therapy Evaluation  Patient Details  Name: SHELVY PERAZZO MRN: [id number] Date of Birth: 1971-07-25 Referring Provider (PT): [name]   Encounter Date: 06/18/2021   PT End of Session - 06/20/21 1455     Visit Number 1    Number of Visits 12    Date for PT Re-Evaluation 07/30/21    Authorization Type UHC    PT Start Time 0805    PT Stop Time 0850    PT Time Calculation (min) 45 min    Activity Tolerance Patient tolerated treatment well    Behavior During Therapy [facility] for tasks assessed/performed             Past Medical History:  Diagnosis Date   ALLERGIC RHINITIS 08/29/2007   BACK PAIN 01/08/2009   GASTROENTERITIS, ACUTE 01/08/2009   GERD 08/29/2007   HEARING LOSS 05/10/2008   ONYCHOMYCOSIS, TOENAILS 09/14/2008   Shingles outbreak 06/24/2011   SKIN LESION 10/28/2010    Past Surgical History:  Procedure Laterality Date   pilonidal cystectomy      There were no vitals filed for this visit.    Subjective Assessment - 06/20/21 1450     Subjective Pt states recent onset of complicated symptoms. He states Legs getting weak, tingly, and UE tingly as well, with accompanied nausea, states still happening about 2-3x/wk. He denies any dizziness, or passing out. He has had cardiac workup, negative. He has had appt with Neurology, with no conclusive findings. Pt states significant disruption of his normal life, due to symptoms. He is having to take more breaks from work and family duties due to symptoms. He has not been to gym in 2 months due to symptoms. He normally exercises 3x/wk. He does not feel that he has been more stressed lately. He currently states fear of activity and exercise, due to probable onset of symptoms. Symptoms seem to arise and worsened when body is fatigued, decreased sleep, or with elevated normal activity,  mowing lawn, exercise, etc.    Limitations Lifting;Standing;Walking;House hold activities    Patient Stated Goals Return to exercise safely,  Get answers on cause of new symptoms.    Currently in Pain? No/denies                [facility] PT Assessment - 06/20/21 0001       Assessment   Medical Diagnosis Disc Bulge    Referring Provider (PT) [name]    Prior Therapy no      Balance Screen   Has the patient fallen in the past 6 months No      Prior Function   Level of Independence Independent      Cognition   Overall Cognitive Status Within Functional Limits for tasks assessed      ROM / Strength   AROM / PROM / Strength AROM;Strength      AROM   Overall AROM Comments UE: WNL,  LE: WNL, Mild limtiation for lumbar flex/ext and hip rotatoin;      Strength   Overall Strength Comments UE: WNL, LE: WNL                        Objective measurements completed on examination: See above findings.               PT Education - 06/20/21 1454  Education Details Discussed need for f/u with neurology and/or PCP for further testing, assessment for symptoms.    Person(s) Educated Patient    Methods Explanation;Demonstration;Verbal cues    Comprehension Verbalized understanding;Returned demonstration                 PT Long Term Goals - 06/20/21 1457       PT LONG TERM GOAL #1   Title Pt to be independent with final HEP    Time 6    Period Weeks    Status New    Target Date 07/30/21      PT LONG TERM GOAL #2   Title Pt to demo and report ability for exercise , cardio and strength training, for at least 30 min without onset of symptoms, to ensure safety with functional and community activity.    Time 6    Period Weeks    Status New    Target Date 07/30/21                    Plan - 06/20/21 1505     Clinical Impression Statement Pt presents with concerning symptoms, without actual diagnosis. Recommended that pt f/u with  neurology. He does have lumbar MRI, but findings do not seem consistent with his symptoms. Pt with ROM and strength normal with testing today. He has no muscle weakness, but at times of his episodes, is having muscle fatigue and weakness, with feelings of tingling, and that he may fall if he continues to be active. Pt has limitations with ability for functional activity and exercise. Pt would like some direction on safe exercise and activities at this time. Discussed not over stressing body, as this seems to bring on symptoms. Pt to benefit from a few visits to educate on safe HEP, but do recommend that pt needs to f/u with neurology for further assessment. Pt states understanding.    Personal Factors and Comorbidities Past/Current Experience    Examination-Activity Limitations Lift;Stand;Locomotion Level;Carry;Squat;Stairs    Examination-Participation Restrictions Yard Work;Community Activity;Occupation;Shop    Stability/Clinical Decision Making Evolving/Moderate complexity    Clinical Decision Making Moderate    Rehab Potential Good    PT Frequency 2x / week    PT Duration 6 weeks    PT Treatment/Interventions ADLs/Self Care Home Management;DME Instruction;Gait training;Stair training;Functional mobility training;Therapeutic activities;Therapeutic exercise;Balance training;Patient/family education;Neuromuscular re-education;Manual techniques;Dry needling;Passive range of motion;Spinal Manipulations;Joint Manipulations    Consulted and Agree with Plan of Care Patient             Patient will benefit from skilled therapeutic intervention in order to improve the following deficits and impairments:  Decreased mobility, Decreased safety awareness, Decreased activity tolerance  Visit Diagnosis: Muscle weakness (generalized)     Problem List Patient Active Problem List   Diagnosis Date Noted   Exertional chest pain 04/12/2020   Degenerative disc disease, lumbar 05/24/2019   Erectile  dysfunction 07/31/2014   Hypertriglyceridemia 10/06/2011   Onychomycosis 09/14/2008   Allergic rhinitis 08/29/2007   GERD 08/29/2007   CYST, PILONIDAL W/ABSCESS 08/29/2007    Lyndee Hensen, PT, DPT 3:13 PM  06/20/21    [facility] [address] [place], [place], [address] Phone: [phone]   Fax:  [phone]  Name: TERRIUS GENTILE MRN: [id number] Date of Birth: 1971/02/20

## 2021-06-23 ENCOUNTER — Encounter: Payer: Self-pay | Admitting: Family Medicine

## 2021-07-11 ENCOUNTER — Encounter: Payer: Self-pay | Admitting: Physical Therapy

## 2021-07-11 ENCOUNTER — Ambulatory Visit (INDEPENDENT_AMBULATORY_CARE_PROVIDER_SITE_OTHER): Payer: 59 | Admitting: Physical Therapy

## 2021-07-11 ENCOUNTER — Other Ambulatory Visit: Payer: Self-pay

## 2021-07-11 DIAGNOSIS — M6281 Muscle weakness (generalized): Secondary | ICD-10-CM | POA: Diagnosis not present

## 2021-07-11 NOTE — Therapy (Signed)
[facility] [address] [place], [place], [address] Phone: [phone]   Fax:  [phone]  Physical Therapy Treatment  Patient Details  Name: Tanner [name] MRN: [id number] Date of Birth: 1971/04/28 Referring Provider (PT): [name]   Encounter Date: 07/11/2021   PT End of Session - 07/11/21 1019     Visit Number 2    Number of Visits 12    Date for PT Re-Evaluation 07/30/21    Authorization Type UHC    PT Start Time 0935    PT Stop Time 1017    PT Time Calculation (min) 42 min    Activity Tolerance Patient tolerated treatment well    Behavior During Therapy [facility] for tasks assessed/performed             Past Medical History:  Diagnosis Date   ALLERGIC RHINITIS 08/29/2007   BACK PAIN 01/08/2009   GASTROENTERITIS, ACUTE 01/08/2009   GERD 08/29/2007   HEARING LOSS 05/10/2008   ONYCHOMYCOSIS, TOENAILS 09/14/2008   Shingles outbreak 06/24/2011   SKIN LESION 10/28/2010    Past Surgical History:  Procedure Laterality Date   pilonidal cystectomy      There were no vitals filed for this visit.   Subjective Assessment - 07/11/21 1353     Subjective Pt states some improvements with intensity of symptoms. He has been taking some supplements, and feels it is helping, and helping him sleep better. He is getting more restful sleep, is still waking up a few times per night. He is still geting "weakness" feeling in legs, mostly when body is fatigued at end of the day, and after higher level actvitiy, workouts, or in the heat, but reports sympotms are not as intense. He has f/u with neurology in a week or so. He has been able to exercie at gym 2x/wk, and is feeing good about that.    Currently in Pain? No/denies                               [facility] Adult PT Treatment/Exercise - 07/11/21 0001       Exercises   Exercises Knee/Hip      Knee/Hip Exercises: Stretches   Active Hamstring Stretch 3 reps;30 seconds     Active Hamstring Stretch Limitations seated    Piriformis Stretch 3 reps;30 seconds    Piriformis Stretch Limitations seated    Other Knee/Hip Stretches SKTC 30 sec x 3; LTR 10 sec x 10;  Childs pose 30 sec x 3;      Knee/Hip Exercises: Aerobic   Recumbent Bike L2 x 8 min;      Knee/Hip Exercises: Standing   Functional Squat 20 reps    Functional Squat Limitations 25 lb      Knee/Hip Exercises: Seated   Sit to Sand 10 reps      Knee/Hip Exercises: Supine   Bridges 10 reps    Other Supine Knee/Hip Exercises education on TA contraction and safe core exercises for gym, avoiding too much stress on low back. bicycle x 10                    PT Education - 07/11/21 1355     Education Details Discussed safe exercise, and safe progressions.    Person(s) Educated Patient    Methods Explanation;Demonstration;Tactile cues;Verbal cues;Handout    Comprehension Verbalized understanding;Returned demonstration;Verbal cues required;Need further instruction;Tactile cues required  PT Long Term Goals - 06/20/21 1457       PT LONG TERM GOAL #1   Title Pt to be independent with final HEP    Time 6    Period Weeks    Status New    Target Date 07/30/21      PT LONG TERM GOAL #2   Title Pt to demo and report ability for exercise , cardio and strength training, for at least 30 min without onset of symptoms, to ensure safety with functional and community activity.    Time 6    Period Weeks    Status New    Target Date 07/30/21                   Plan - 07/11/21 1356     Clinical Impression Statement Reviewed stretches today for improving mobility. Also able to perfom bike, for light cardio exercise, discussed adding this into his workout, starting at 10 min for bike or elliptical. Pt able to perform exercises today without any onset of symptoms or weakness in LEs. Discussed safe exercise progression, and not over doing activity, as well as exercising  indoors, for decreased heat intolerance. Even though pts symtoms are doing a bit better, still recommend he have his f/u with neurology, bc he is still getting symptoms that are out of the ordinary for his body.    Personal Factors and Comorbidities Past/Current Experience    Examination-Activity Limitations Lift;Stand;Locomotion Level;Carry;Squat;Stairs    Examination-Participation Restrictions Yard Work;Community Activity;Occupation;Shop    Stability/Clinical Decision Making Evolving/Moderate complexity    Rehab Potential Good    PT Frequency 2x / week    PT Duration 6 weeks    PT Treatment/Interventions ADLs/Self Care Home Management;DME Instruction;Gait training;Stair training;Functional mobility training;Therapeutic activities;Therapeutic exercise;Balance training;Patient/family education;Neuromuscular re-education;Manual techniques;Dry needling;Passive range of motion;Spinal Manipulations;Joint Manipulations    Consulted and Agree with Plan of Care Patient             Patient will benefit from skilled therapeutic intervention in order to improve the following deficits and impairments:  Decreased mobility, Decreased safety awareness, Decreased activity tolerance  Visit Diagnosis: Muscle weakness (generalized)     Problem List Patient Active Problem List   Diagnosis Date Noted   Exertional chest pain 04/12/2020   Degenerative disc disease, lumbar 05/24/2019   Erectile dysfunction 07/31/2014   Hypertriglyceridemia 10/06/2011   Onychomycosis 09/14/2008   Allergic rhinitis 08/29/2007   GERD 08/29/2007   CYST, PILONIDAL W/ABSCESS 08/29/2007   Lyndee Hensen, PT, DPT 2:01 PM  07/11/21   [facility] [address] [place], [place], [address] Phone: [phone]   Fax:  [phone]  Name: Tanner [name] MRN: [id number] Date of Birth: Feb 14, 1971

## 2021-07-22 ENCOUNTER — Ambulatory Visit: Payer: 59 | Admitting: Diagnostic Neuroimaging

## 2021-07-22 ENCOUNTER — Encounter: Payer: Self-pay | Admitting: Diagnostic Neuroimaging

## 2021-07-22 VITALS — BP 121/82 | HR 71 | Ht 69.0 in | Wt 213.0 lb

## 2021-07-22 DIAGNOSIS — R200 Anesthesia of skin: Secondary | ICD-10-CM

## 2021-07-22 DIAGNOSIS — R202 Paresthesia of skin: Secondary | ICD-10-CM | POA: Diagnosis not present

## 2021-07-22 NOTE — Progress Notes (Signed)
[facility]  PATIENT: Tanner [name] DOB: 1971/10/13  REFERRING CLINICIAN: [name], MD HISTORY FROM: patient  REASON FOR VISIT: follow up   HISTORICAL  CHIEF COMPLAINT:  Chief Complaint  Patient presents with   Follow-up    Rm 6 alone Pt is well, symptoms have decreased since taking vit b complex and melatonin . Periodic numbness in feet and legs.     HISTORY OF PRESENT ILLNESS:   UPDATE (07/22/21, VRP): Since last visit, doing better. Symptoms are improved with better sleep, exercise. Now using multivitamin, melatonin supplement.   PRIOR HPI: 50 year old male with with intermittent episodes of numbness, nausea, fatigue and brain fog.  Symptoms have been going on for past 1 month.  Symptoms occurring every few days.  No recent triggering or aggravating factors.  Past few years patient also having intermittent episodes of nausea, lightheadedness, shortness of breath, chest pain.  Patient has been to PCP, cardiology, ER multiple times for evaluation.    REVIEW OF SYSTEMS: Full 14 system review of systems performed and negative with exception of: as per HPI.  ALLERGIES: No Known Allergies  HOME MEDICATIONS: Outpatient Medications Prior to Visit  Medication Sig Dispense Refill   b complex vitamins capsule Take 1 capsule by mouth daily.     cholecalciferol (VITAMIN D3) 25 MCG (1000 UNIT) tablet Take 1,000 Units by mouth daily.     Fexofenadine HCl (ALLEGRA ALLERGY PO) Take by mouth.     melatonin 3 MG TABS tablet Take 3 mg by mouth at bedtime.     Multiple Vitamin (MULTIVITAMIN) capsule Take 1 capsule by mouth daily.     Cyanocobalamin (VITAMIN B-12 PO) Take 2,500 mg by mouth.     No facility-administered medications prior to visit.    PAST MEDICAL HISTORY: Past Medical History:  Diagnosis Date   ALLERGIC RHINITIS 08/29/2007   BACK PAIN 01/08/2009   GASTROENTERITIS, ACUTE 01/08/2009   GERD 08/29/2007   HEARING LOSS 05/10/2008    ONYCHOMYCOSIS, TOENAILS 09/14/2008   Shingles outbreak 06/24/2011   SKIN LESION 10/28/2010    PAST SURGICAL HISTORY: Past Surgical History:  Procedure Laterality Date   pilonidal cystectomy      FAMILY HISTORY: Family History  Problem Relation Age of Onset   Lung cancer Mother 92       Long-term smoker   Coronary artery disease Father 87       Diagnosis 68-had CABG along with valve replacement;. 78 at death- MI complications. 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    5th child 2019.    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   He also does walk on the treadmill.   Usually exercises about 40 minutes a day 3 days a week.  He also tries occasionally gets up and walks around the office throughout the day.   Social Determinants of Health   Financial Resource Strain: Not on file  Food  Insecurity: Not on file  Transportation Needs: Not on file  Physical Activity: Not on file  Stress: Not on file  Social Connections: Not on file  Intimate Partner Violence: Not on file     PHYSICAL EXAM  GENERAL EXAM/CONSTITUTIONAL: Vitals:  Vitals:   07/22/21 0817  BP: 121/82  Pulse: 71  Weight: 213 lb (96.6 kg)  Height: 5' 9"  (1.753 m)   Body mass index is 31.45 kg/m. Wt Readings from Last 3 Encounters:  07/22/21 213 lb (96.6 kg)  05/13/21 218 lb (98.9 kg)  05/07/21 215 lb 9.6 oz (97.8 kg)   Patient is in no distress; well developed, nourished and groomed; neck is supple  CARDIOVASCULAR: Examination of carotid arteries is normal; no carotid bruits Regular rate and rhythm, no murmurs Examination of peripheral vascular system by observation and palpation is normal  EYES: Ophthalmoscopic exam of optic discs and posterior segments is normal; no papilledema or hemorrhages No results found.  MUSCULOSKELETAL: Gait, strength, tone, movements noted in Neurologic exam below  NEUROLOGIC: MENTAL STATUS:  No flowsheet data found. awake, alert, oriented to person, place and time recent and remote memory intact normal attention and concentration language fluent, comprehension intact, naming intact fund of knowledge appropriate  CRANIAL NERVE:  2nd - no papilledema on fundoscopic exam 2nd, 3rd, 4th, 6th - pupils equal and reactive to light, visual fields full to confrontation, extraocular muscles intact, no nystagmus 5th - facial sensation symmetric 7th - facial strength symmetric 8th - hearing intact 9th - palate elevates symmetrically, uvula midline 11th - shoulder shrug symmetric 12th - tongue protrusion midline  MOTOR:  normal bulk and tone, full strength in the BUE, BLE  SENSORY:  normal and symmetric to light touch, temperature, vibration  COORDINATION:  finger-nose-finger, fine finger movements normal  REFLEXES:  deep tendon reflexes present and  symmetric  GAIT/STATION:  narrow based gait     DIAGNOSTIC DATA (LABS, IMAGING, TESTING) - I reviewed patient records, labs, notes, testing and imaging myself where available.  Lab Results  Component Value Date   WBC 6.5 04/28/2021   HGB 16.1 04/28/2021   HCT 48.1 04/28/2021   MCV 89.0 04/28/2021   PLT 247.0 04/28/2021      Component Value Date/Time   NA 138 04/28/2021 0803   K 4.4 04/28/2021 0803   CL 104 04/28/2021 0803   CO2 27 04/28/2021 0803   GLUCOSE 89 04/28/2021 0803   BUN 9 04/28/2021 0803   CREATININE 1.00 04/28/2021 0803   CALCIUM 9.0 04/28/2021 0803   PROT 7.3 04/28/2021 0803   ALBUMIN 4.3 04/28/2021 0803   AST 27 04/28/2021 0803   ALT 32 04/28/2021 0803   ALKPHOS 101 04/28/2021 0803   BILITOT  0.8 04/28/2021 0803   GFRNONAA >60 04/09/2021 2305   GFRAA >60 05/17/2015 1354   Lab Results  Component Value Date   CHOL 138 04/28/2021   HDL 29.90 (L) 04/28/2021   LDLCALC 94 04/12/2020   LDLDIRECT 85.0 04/28/2021   TRIG 212.0 (H) 04/28/2021   CHOLHDL 5 04/28/2021   No results found for: HGBA1C Lab Results  Component Value Date   VITAMINB12 363 04/28/2021   Lab Results  Component Value Date   TSH 1.96 04/28/2021    Lipid Panel     Component Value Date/Time   CHOL 138 04/28/2021 0803   TRIG 212.0 (H) 04/28/2021 0803   HDL 29.90 (L) 04/28/2021 0803   CHOLHDL 5 04/28/2021 0803   VLDL 42.4 (H) 04/28/2021 0803   LDLCALC 94 04/12/2020 1008   LDLDIRECT 85.0 04/28/2021 0803   MRI brain, lumbar spine --> unremarkable   ASSESSMENT AND PLAN  50 y.o. year old male here with abnormal sensations of nausea, lightheadedness, fatigue, brain fog, balance difficulty, numbness and tingling in the calves and legs, low back pain.   Dx:  1. Numbness and tingling      PLAN:   NUMBNESS IN LEGS / INSOMNIA / ANXIETY (improving) - sleep hygiene reviewed - continue exercise, vitamin supplements and melatonin  Return for return to PCP, pending if symptoms  worsen or fail to improve.    Penni Bombard, MD 01/21/3817, 4:03 AM Certified in Neurology, Neurophysiology and Neuroimaging  [facility] [address], [place], [place] [address] [phone]

## 2021-07-22 NOTE — Patient Instructions (Signed)
  NUMBNESS IN LEGS / INSOMNIA / ANXIETY - sleep hygiene reviewed - continue exercise, vitamin suplements and melatonin

## 2021-07-29 ENCOUNTER — Ambulatory Visit: Payer: Self-pay | Admitting: Neurology

## 2021-07-31 ENCOUNTER — Other Ambulatory Visit: Payer: Self-pay

## 2021-07-31 ENCOUNTER — Encounter: Payer: Self-pay | Admitting: Physical Therapy

## 2021-07-31 ENCOUNTER — Ambulatory Visit (INDEPENDENT_AMBULATORY_CARE_PROVIDER_SITE_OTHER): Payer: 59 | Admitting: Physical Therapy

## 2021-07-31 DIAGNOSIS — M6281 Muscle weakness (generalized): Secondary | ICD-10-CM

## 2021-07-31 DIAGNOSIS — F419 Anxiety disorder, unspecified: Secondary | ICD-10-CM

## 2021-07-31 NOTE — Therapy (Signed)
[facility] [address] [place], [place], [address] Phone: [phone]   Fax:  [phone]  Physical Therapy Treatment/Discharge   Patient Details  Name: Tanner [name] MRN: [id number] Date of Birth: 06/24/1971 Referring Provider (PT): [name]   Encounter Date: 07/31/2021   PT End of Session - 07/31/21 0925     Visit Number 3    Number of Visits 12    Date for PT Re-Evaluation 07/30/21    Authorization Type UHC    PT Start Time 0850    PT Stop Time 0913    PT Time Calculation (min) 23 min    Activity Tolerance Patient tolerated treatment well    Behavior During Therapy [facility] for tasks assessed/performed             Past Medical History:  Diagnosis Date   ALLERGIC RHINITIS 08/29/2007   BACK PAIN 01/08/2009   GASTROENTERITIS, ACUTE 01/08/2009   GERD 08/29/2007   HEARING LOSS 05/10/2008   ONYCHOMYCOSIS, TOENAILS 09/14/2008   Shingles outbreak 06/24/2011   SKIN LESION 10/28/2010    Past Surgical History:  Procedure Laterality Date   pilonidal cystectomy      There were no vitals filed for this visit.   Subjective Assessment - 07/31/21 0923     Subjective Pt has had f/u with neurology. States he is doing much better at this time. Feels that b vitamin and melatonin have made a huge difference in his sleep, as well as symptoms. He states very mild tiredness at end of day in his Legs or with over exertion. 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    Currently in Pain? No/denies                               [facility] Adult PT Treatment/Exercise - 07/31/21 0001       Self-Care   Self-Care Other Self-Care Comments    Other Self-Care Comments  Discussion of safe activities for gym, progression of current gym activities, LE stretches to include: back, HS, hips, gastrocs, Cardio progression.      Exercises   Exercises Knee/Hip      Knee/Hip  Exercises: Stretches   Active Hamstring Stretch Limitations reviewed for HEP    Piriformis Stretch Limitations reviewed for HEP    Gastroc Stretch Limitations reviewed for HEP      Knee/Hip Exercises: Standing   Heel Raises Limitations reviewed for HEP                     PT Education - 07/31/21 0925     Education Details Reviewed best ther ex, HEP, gym activities.    Person(s) Educated Patient    Methods Explanation;Demonstration;Verbal cues    Comprehension Verbalized understanding;Returned demonstration;Verbal cues required                 PT Long Term Goals - 07/31/21 0925       PT LONG TERM GOAL #1   Title Pt to be independent with final HEP    Time 6    Period Weeks    Status Achieved      PT LONG TERM GOAL #2   Title Pt to demo and report ability for exercise , cardio and strength training, for at least 30 min without onset of symptoms, to ensure safety with functional and community  activity.    Time 6    Period Weeks    Status Achieved                   Plan - 07/31/21 0926     Clinical Impression Statement Pt doing very well at this time, and has made much progress with ability for exercise and functional activity. He is only having mild weakness/tiredness in LEs with over exertion or when being tired at end of the day. Reviewed safe exercise, progression of strength and cardio, as well as mobility and stretching in detail today. Pt has met goals, no further deficits requiring PT. Pt ready for d/c to HEP, pt in agreement with plan.    Personal Factors and Comorbidities Past/Current Experience    Examination-Activity Limitations Lift;Stand;Locomotion Level;Carry;Squat;Stairs    Examination-Participation Restrictions Yard Work;Community Activity;Occupation;Shop    Stability/Clinical Decision Making Evolving/Moderate complexity    Rehab Potential Good    PT Frequency 2x / week    PT Duration 6 weeks    PT Treatment/Interventions ADLs/Self  Care Home Management;DME Instruction;Gait training;Stair training;Functional mobility training;Therapeutic activities;Therapeutic exercise;Balance training;Patient/family education;Neuromuscular re-education;Manual techniques;Dry needling;Passive range of motion;Spinal Manipulations;Joint Manipulations    Consulted and Agree with Plan of Care Patient             Patient will benefit from skilled therapeutic intervention in order to improve the following deficits and impairments:  Decreased mobility, Decreased safety awareness, Decreased activity tolerance  Visit Diagnosis: Muscle weakness (generalized)     Problem List Patient Active Problem List   Diagnosis Date Noted   Exertional chest pain 04/12/2020   Degenerative disc disease, lumbar 05/24/2019   Erectile dysfunction 07/31/2014   Hypertriglyceridemia 10/06/2011   Onychomycosis 09/14/2008   Allergic rhinitis 08/29/2007   GERD 08/29/2007   CYST, PILONIDAL W/ABSCESS 08/29/2007   Lyndee Hensen, PT, DPT 9:29 AM  07/31/21    [facility] [place], [place], [address] Phone: [phone]   Fax:  [phone]  Name: Tanner [name] MRN: [id number] Date of Birth: 02-05-1971  PHYSICAL THERAPY DISCHARGE SUMMARY  Visits from Start of Care: 3  Plan: Patient agrees to discharge.  Patient goals were met. Patient is being discharged due to meeting the stated rehab goals.     Lyndee Hensen, PT, DPT 9:29 AM  07/31/21

## 2021-08-12 NOTE — Progress Notes (Incomplete)
Phone [phone] In person visit   Subjective:   Tanner [name] is a 50 y.o. year old very pleasant male patient who presents for/with See problem oriented charting No chief complaint on file.   This visit occurred during the SARS-CoV-2 public health emergency.  Safety protocols were in place, including screening questions prior to the visit, additional usage of staff PPE, and extensive cleaning of exam room while observing appropriate contact time as indicated for disinfecting solutions.   Past Medical History-  Patient Active Problem List   Diagnosis Date Noted   Exertional chest pain 04/12/2020   Degenerative disc disease, lumbar 05/24/2019   Erectile dysfunction 07/31/2014   Hypertriglyceridemia 10/06/2011   Onychomycosis 09/14/2008   Allergic rhinitis 08/29/2007   GERD 08/29/2007   CYST, PILONIDAL W/ABSCESS 08/29/2007    Medications- reviewed and updated Current Outpatient Medications  Medication Sig Dispense Refill   b complex vitamins capsule Take 1 capsule by mouth daily.     cholecalciferol (VITAMIN D3) 25 MCG (1000 UNIT) tablet Take 1,000 Units by mouth daily.     Fexofenadine HCl (ALLEGRA ALLERGY PO) Take by mouth.     melatonin 3 MG TABS tablet Take 3 mg by mouth at bedtime.     Multiple Vitamin (MULTIVITAMIN) capsule Take 1 capsule by mouth daily.     No current facility-administered medications for this visit.     Objective:  There were no vitals taken for this visit. Gen: NAD, resting comfortably CV: RRR no murmurs rubs or gallops Lungs: CTAB no crackles, wheeze, rhonchi Abdomen: soft/nontender/nondistended/normal bowel sounds. No rebound or guarding.  Ext: no edema Skin: warm, dry Neuro: grossly normal, moves all extremities  ***    Assessment and Plan    #Intermittent paresthesias of bilateral lower extremities associated with fatigue and imbalance sensation S: Patient presented on 04/28/2021 with numbness and tingling in his legs and hands,  intermittent nausea, intermittent lightheadedness and fatigue.  Was having some good days and bad days.  Had palpitations and sweats at times as well.  No diplopia dysarthria or paresis.  He was doing well at the gym at that time.  He was concerned issues could be related to caffeine or sildenafil.  No history of panic attacks or mood or anxiety issues but patient did report being a chronic worrier-primarily financial with 5 children but did not feel like stressors were worse than normal.   There was some concern issues could be related to anxiety or panic attacks with differential including peripheral neuropathy or other causes.  Unlikely cardiac.  Minimal risk factors for early CVA and symptoms were not consistent with CVA.  Plan was for nerve conduction studies to rule out peripheral neuropathy and trial low-dose Xanax to see if that helps with symptoms.  Vitamin D reassured, B12 and folate reassured (though B12 under 400 and patient was started on B12 supplementation), TSH normal.  LDL reasonable for his age at 27.  CMP and CBC normal.  Triglycerides minimally elevated at 212.Nerve conduction study ordered but not yet scheduled.  Pt reported on 06/22, he can be standing around and legs suddenly go numb- if touched them he felt desensitized. Also gets overly tired feeling when that happened and also felt imbalanced overall. No vertigo. Felt like legs are heavy. Usually lower legs- more rarely mild feeling in fingertips. Seemed like salt intake may have positive impact- chips were helpful or salty foods last night. Reported not having palpitations lately.Not having back pain. Tried some allegra to see  if would help with balance issues but had not noted any difference in symptoms.  Did not acutely feel dizzy or lightheaded in the ears or head. Did start low dose b12 and had noted some improvement in fatigue symptoms. Happened every other day or so still with numbness- can be mild then worse can last most of day  though A/P: ***  # GERD S:Medication: omeprazole 40 mg. Still with some occasional symptoms.   B12 levels related to PPI use: Lab Results  Component Value Date   VITAMINB12 363 04/28/2021   A/P: ***   #ED- cialis/viagra- seemed to work well for wife- on cialis most recently. Started taking cialis regularly. Also noting doing 4 cups of coffee a day. Also has done some red bulls or monster drinks. Sometimes was having triple shot vanilla latte as well. He has tried to reduce his caffeine intake as a result   #Chest pain Patient had several episodes of chest pain and refected back to winder if all of the above were triggers for him. Had stress test which was low risk back in march. Last visit 02/21/21 and planned was to consider coronary calcium  Score if ongoing issues (held off)And was doing ok for a while and then had resurgence of symptoms recently- 2 ER trips. 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 Later seen again on 04/10/21 (dizzy, chest pinches- some chills he wonders if could be from withdrawal from caffeine- been trying to cut down after higher levels) and reassured workup with ct angio after d dimer slightly positive- had 3 small pneumothoraces- they monitored and then repeated CXR with no clear pneumothorax- cardiothoracic surgery recommended no intervention, troponin negative.  -he had decided to come off of cialis -he was cutting down on coffee -he was cutting out redbull -no calcium on ct scan- dont think we need to pursue coronary calcium scoring  A/P: ***  #lipids mildly elevated- 10 year ascvd risk under 3%- remained off medication at this time    Recommended follow up: No follow-ups on file. Future Appointments  Date Time Provider [place]   08/14/2021  8:45 AM Lyndee Hensen, PT [facility]  08/15/2021  2:20 PM Yong Channel Brayton Mars, MD [facility]    Lab/Order associations: No diagnosis found.  No orders of the defined types were placed in this encounter.   I,Jada Bradford,acting as a scribe for [name], MD.,have documented all relevant documentation on the behalf of [name], MD,as directed by  [name], MD while in the presence of [name], MD.  ***  Return precautions advised.  Burnett Corrente

## 2021-08-14 ENCOUNTER — Encounter: Payer: 59 | Admitting: Physical Therapy

## 2021-08-15 ENCOUNTER — Ambulatory Visit: Payer: 59 | Admitting: Family Medicine

## 2021-08-15 DIAGNOSIS — E785 Hyperlipidemia, unspecified: Secondary | ICD-10-CM

## 2021-08-15 DIAGNOSIS — N528 Other male erectile dysfunction: Secondary | ICD-10-CM

## 2021-08-15 DIAGNOSIS — K219 Gastro-esophageal reflux disease without esophagitis: Secondary | ICD-10-CM

## 2021-08-15 DIAGNOSIS — R079 Chest pain, unspecified: Secondary | ICD-10-CM

## 2021-08-15 IMAGING — CT CT ANGIO CHEST
2 of 7 series · 17 of 46 positions shown · IV contrast (omnipaque)
Comparison: Chest x-ray 04/08/2021, chest x-ray 01/10/2021

CLINICAL DATA: [REDACTED] night he was feeling pinching feelings in
his chest. Patient reports he left without being seen yesterday
after taking blood work at [facility]

EXAM:
CT ANGIOGRAPHY CHEST WITH CONTRAST
TECHNIQUE: Multidetector CT imaging of the chest was performed using the
standard protocol during bolus administration of intravenous
contrast. Multiplanar CT image reconstructions and MIPs were
obtained to evaluate the vascular anatomy.
CONTRAST:  100mL OMNIPAQUE IOHEXOL 350 MG/ML SOLN

[Series 5: pe axial thins · axial · 0.81mm/px · z∈[-688,-446]mm · 14 of 280 slices shown]
[im 19/280  lung]
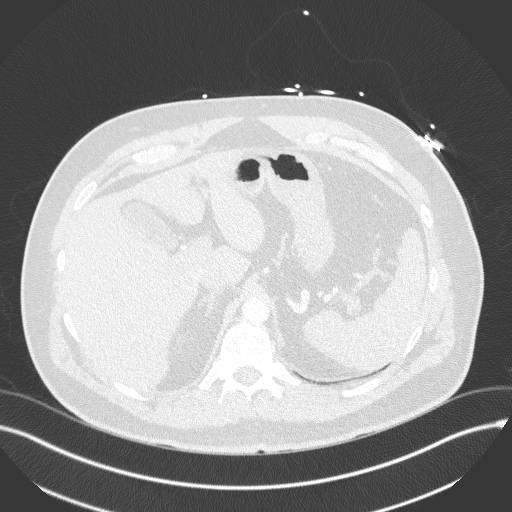
[im 38/280  soft-tissue]
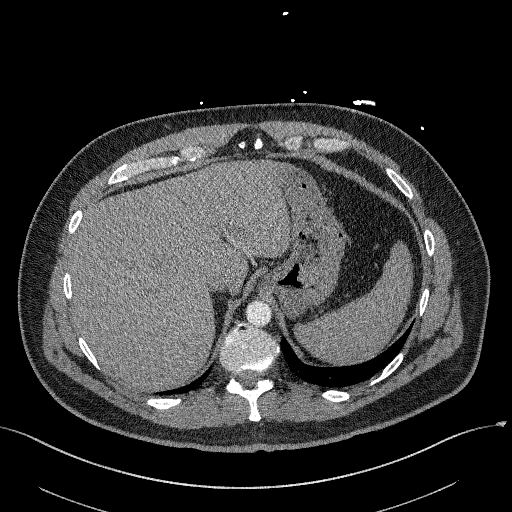
[im 56/280  lung]
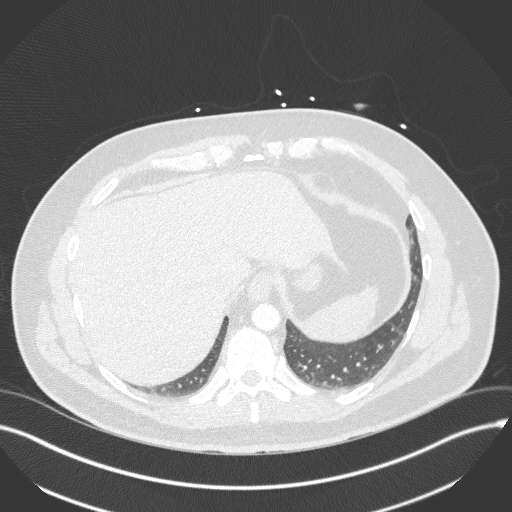
[im 75/280  soft-tissue]
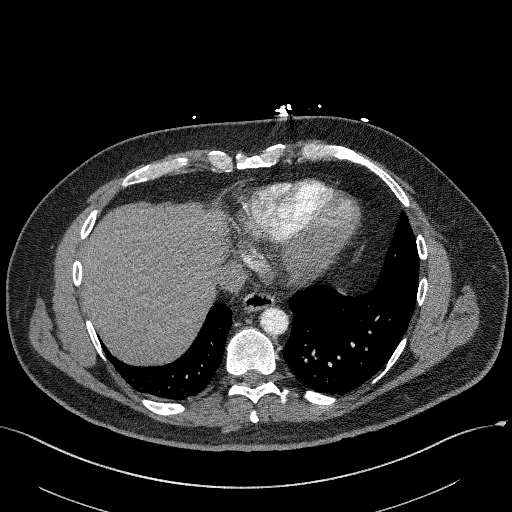
[im 94/280  lung]
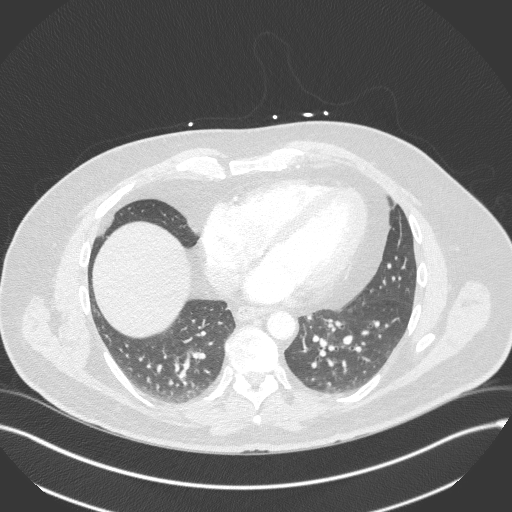
[im 112/280  soft-tissue]
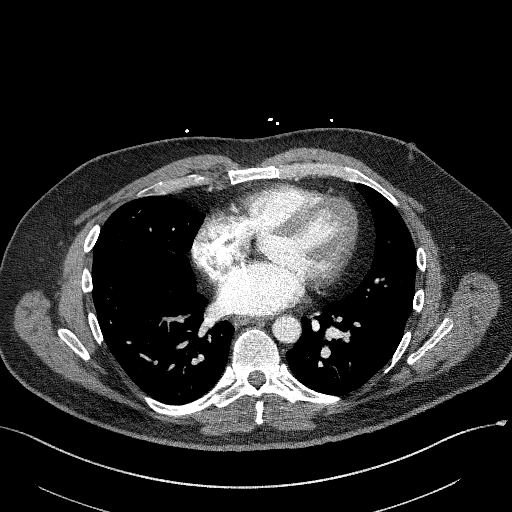
[im 131/280  lung]
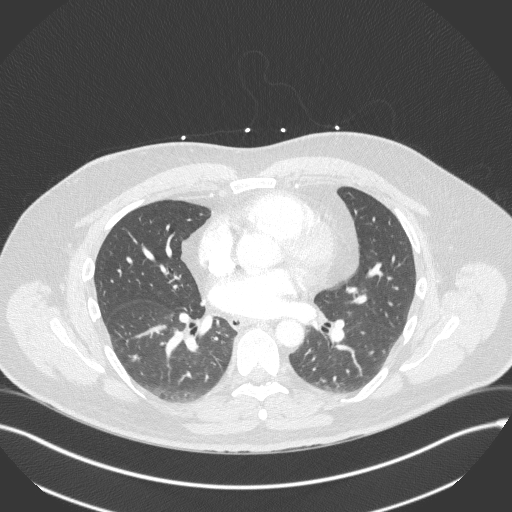
[im 149/280  soft-tissue]
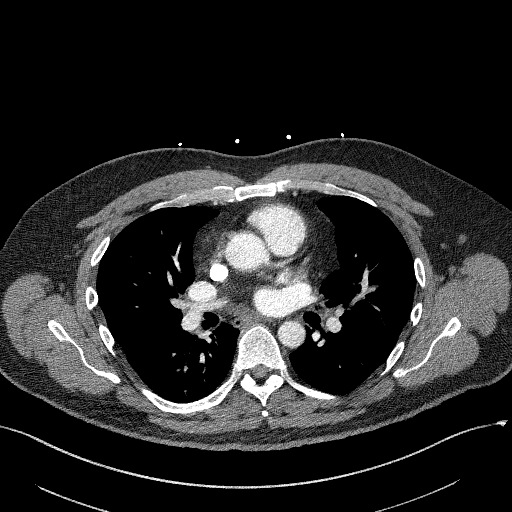
[im 168/280  lung]
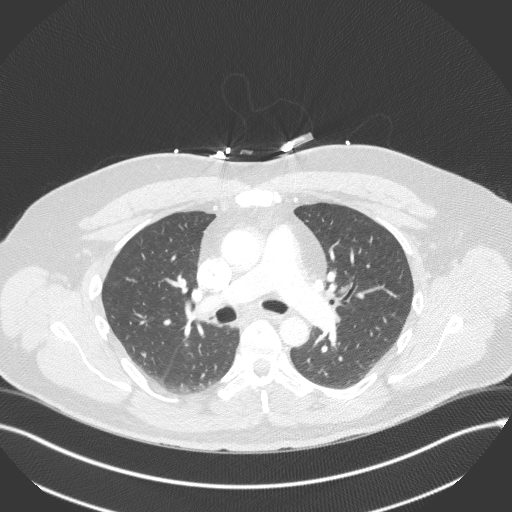
[im 187/280  soft-tissue]
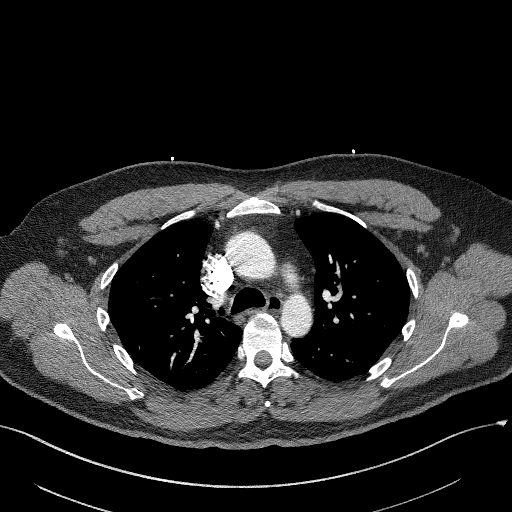
[im 205/280  lung]
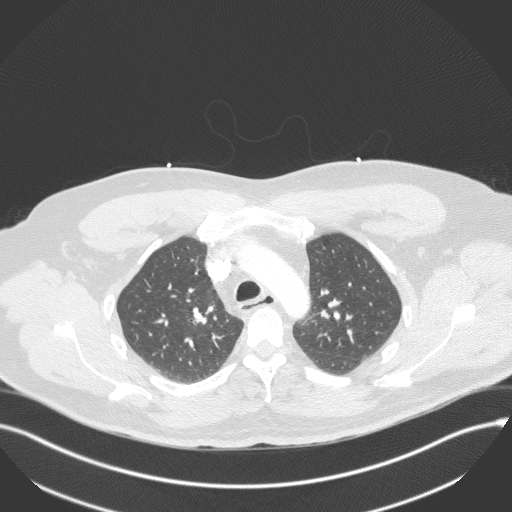
[im 224/280  soft-tissue]
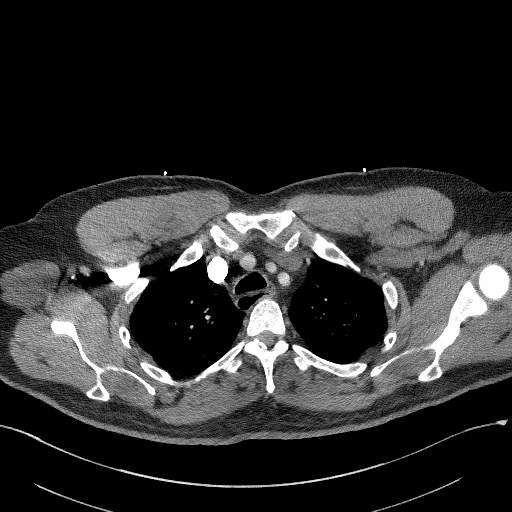
[im 242/280  lung]
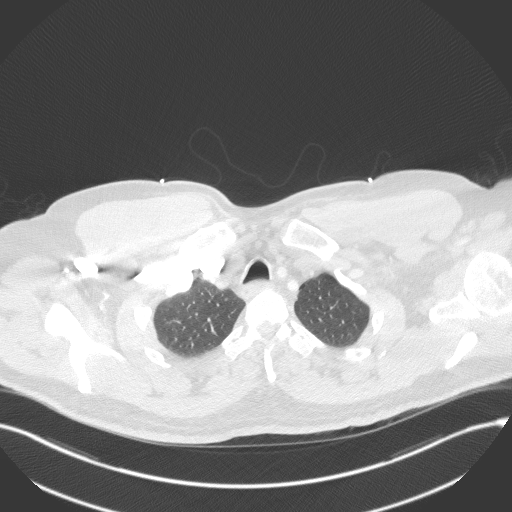
[im 261/280  soft-tissue]
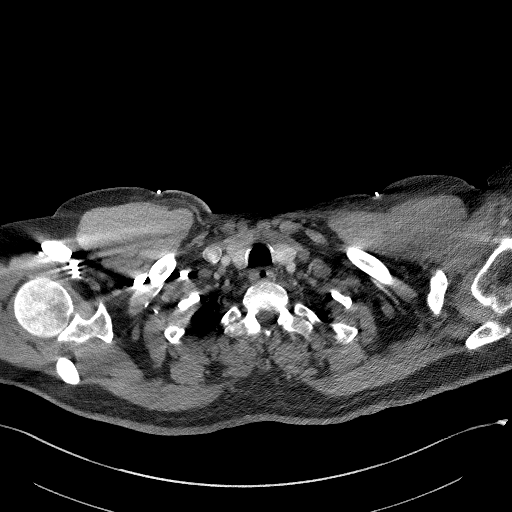

[Series 7: cor soft · coronal · 0.59mm/px · 3 of 118 slices shown]
[im 30/118  soft-tissue]
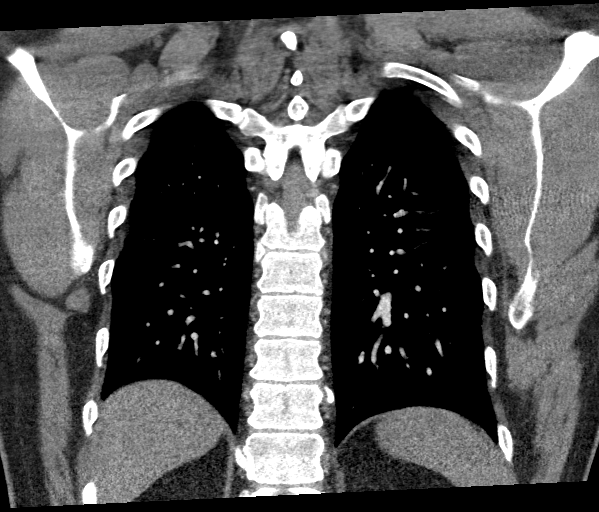
[im 59/118  soft-tissue]
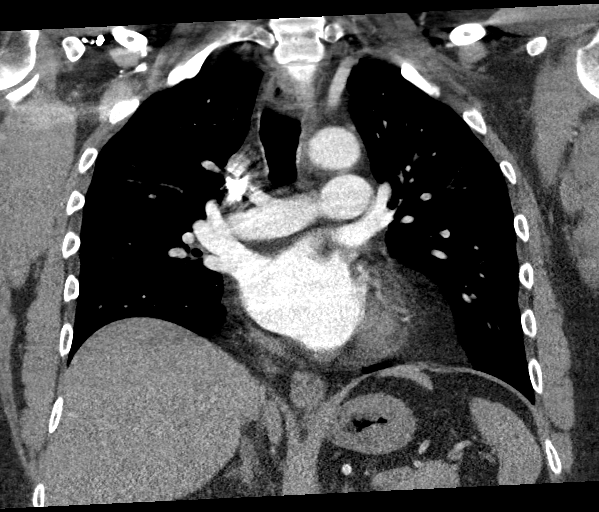
[im 88/118  soft-tissue]
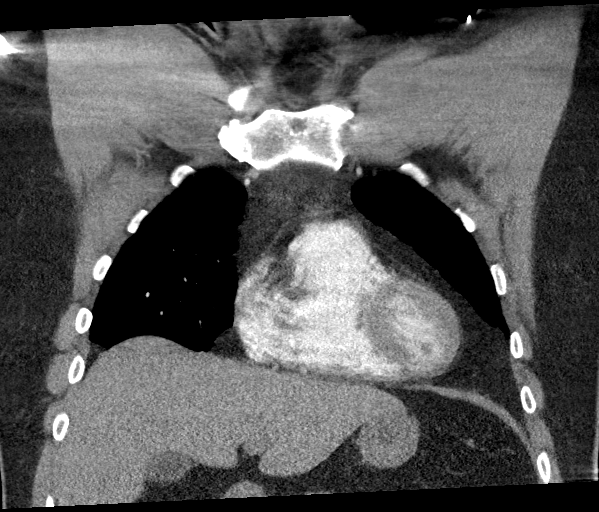

[17 of 46 positions shown; findings below may reference images not displayed]

FINDINGS: Cardiovascular: Satisfactory opacification of the pulmonary arteries
to the segmental level. No evidence of pulmonary embolism. The main
pulmonary artery is normal in caliber. Normal heart size. No
significant pericardial effusion. The thoracic aorta is normal in
caliber. No atherosclerotic plaque of the thoracic aorta. No
coronary artery calcifications. Likely mixing artifact within the
visualized portions of the right internal jugular vein.

Mediastinum/Nodes: No enlarged mediastinal, hilar, or axillary lymph
nodes. Thyroid gland, trachea, and esophagus demonstrate no
significant findings.

Lungs/Pleura: Bilateral lower lobe subsegmental atelectasis. No
focal consolidation. No pulmonary nodule. No pulmonary mass. No
pleural effusion. Trace left apical pneumothorax ([DATE]). Question
associated trace paramediastinal paraseptal emphysematous changes.
No right pneumothorax.

Upper Abdomen: No acute abnormality.

Musculoskeletal: No chest wall abnormality. No acute or significant
osseous findings.

Review of the MIP images confirms the above findings.
IMPRESSION: 1. Trace left apical pneumothorax. Question associated trace
paramediastinal paraseptal emphysematous changes.
2. No pulmonary embolus.

These results were called by telephone at the time of interpretation
on 04/10/2021 at [DATE] to provider EUZILENE BACHI , who verbally
acknowledged these results.

## 2021-08-26 ENCOUNTER — Other Ambulatory Visit: Payer: Self-pay

## 2021-08-26 ENCOUNTER — Ambulatory Visit: Payer: 59 | Admitting: Family Medicine

## 2021-08-26 ENCOUNTER — Encounter: Payer: Self-pay | Admitting: Family Medicine

## 2021-08-26 VITALS — BP 112/74 | HR 67 | Temp 98.0°F | Ht 69.0 in | Wt 214.6 lb

## 2021-08-26 DIAGNOSIS — M5136 Other intervertebral disc degeneration, lumbar region: Secondary | ICD-10-CM

## 2021-08-26 DIAGNOSIS — Z79899 Other long term (current) drug therapy: Secondary | ICD-10-CM

## 2021-08-26 DIAGNOSIS — E538 Deficiency of other specified B group vitamins: Secondary | ICD-10-CM

## 2021-08-26 DIAGNOSIS — R202 Paresthesia of skin: Secondary | ICD-10-CM

## 2021-08-26 LAB — VITAMIN D 25 HYDROXY (VIT D DEFICIENCY, FRACTURES): VITD: 42.52 ng/mL (ref 30.00–100.00)

## 2021-08-26 LAB — VITAMIN B12: Vitamin B-12: 677 pg/mL (ref 211–911)

## 2021-08-26 NOTE — Patient Instructions (Addendum)
Please stop by lab before you go If you have mychart- we will send your results within 3 business days of [place] receiving them.  If you do not have mychart- we will call you about results within 5 business days of [place] receiving them.  *please also note that you will see labs on mychart as soon as they post. I will later go in and write notes on them- will say "notes from Dr. Yong Channel"  I am glad to see that you are doing so much better!  Recommended follow up: Return in about 6 months (around 02/24/2022) for physical or sooner if needed.

## 2021-08-26 NOTE — Progress Notes (Signed)
Phone [phone] In person visit   Subjective:   Tanner [name] is a 50 y.o. year old very pleasant male patient who presents for/with See problem oriented charting Chief Complaint  Patient presents with   Paresthesia Of Both Lower Extremities    Rechecking labs also     This visit occurred during the SARS-CoV-2 public health emergency.  Safety protocols were in place, including screening questions prior to the visit, additional usage of staff PPE, and extensive cleaning of exam room while observing appropriate contact time as indicated for disinfecting solutions.   Past Medical History-  Patient Active Problem List   Diagnosis Date Noted   Hypertriglyceridemia 10/06/2011    Priority: 2.   Degenerative disc disease, lumbar 05/24/2019    Priority: 3.   Erectile dysfunction 07/31/2014    Priority: 3.   Allergic rhinitis 08/29/2007    Priority: 3.   GERD 08/29/2007    Priority: 3.   CYST, PILONIDAL W/ABSCESS 08/29/2007    Priority: 3.   Exertional chest pain 04/12/2020   Onychomycosis 09/14/2008    Medications- reviewed and updated Current Outpatient Medications  Medication Sig Dispense Refill   cholecalciferol (VITAMIN D3) 25 MCG (1000 UNIT) tablet Take 1,000 Units by mouth daily.     Fexofenadine HCl (ALLEGRA ALLERGY PO) Take by mouth.     melatonin 3 MG TABS tablet Take 3 mg by mouth at bedtime.     Multiple Vitamin (MULTIVITAMIN) capsule Take 1 capsule by mouth daily.     No current facility-administered medications for this visit.     Objective:  BP 112/74   Pulse 67   Temp 98 F (36.7 C) (Temporal)   Ht 5' 9"  (1.753 m)   Wt 214 lb 9.6 oz (97.3 kg)   SpO2 96%   BMI 31.69 kg/m  Gen: NAD, resting comfortably CV: RRR no murmurs rubs or gallops Lungs: CTAB no crackles, wheeze, rhonchi Abdomen: soft/nontender/nondistended/normal bowel sounds. No rebound or guarding.  Ext: trace edema Skin: warm, dry     Assessment and Plan  # #Intermittent paresthesias  of bilateral lower extremities associated with fatigue and imbalance sensation S: Patient presented on 04/28/2021 with numbness and tingling in his legs and hands, intermittent nausea, intermittent lightheadedness and fatigue.  Was having some good days and bad days.  Had palpitations and sweats at times as well.  No diplopia dysarthria or paresis.  He was doing well at the gym at that time.  He was concerned issues could be related to caffeine or sildenafil.  No history of panic attacks or mood or anxiety issues but patient did report being a chronic worrier-primarily financial with 5 children but did not feel like stressors were worse than normal.  There was some concern issues could be related to anxiety or panic attacks with differential including peripheral neuropathy or other causes.  Unlikely cardiac.  Minimal risk factors for early CVA and symptoms were not consistent with CVA.  Plan was for nerve conduction studies to rule out peripheral neuropathy and trial low-dose Xanax to see if that helps with symptoms.  Vitamin D reassuring, B12 and folate reassuring (though B12 under 400 and patient was started on B12 supplementation), TSH normal.  LDL reasonable for his age at 17.  CMP and CBC normal.  Triglycerides minimally elevated at 212.  Last visit he reported that he could have been standing around and legs suddenly would go numb- if he touched them he felt desensitized. Also got overly tired feeling when this  happened and also felt imbalanced overall. No vertigo. Felt like legs were heavy. Usually lower legs- more rarely mild feeling in fingertips. Salt intake would have a positive impact - chips would be helpful. He also reports not having palpitations or back pain.   Did start low dose B-12 2000-100 mg daily and had noted some improvement in fatigue symptoms. Episodes would occur every other day or so still with numbness- would be mild then worse and could last most of the day.     Patient did see  neurology on 07/22/2021 - MRI of Brain was normal and MRI of  Lumbar Spine showed that at L5-S1 there was mild disc bulging with mild biforaminal stenosis. He was instructed to have a good sleep hygiene and to continue exercises, vitamin supplements and melatonin.  Nerve conduction study was not completed and neurology did not think this was necessary  - Patient has also been seeing Lyndee Hensen, PT   - 06/18/2021 visit she recommended that he follow-up with neurology for further assessment. Did have lumbar MRI - findings were not consistent with patients symptoms however  - 07/11/2021 visit patient was improving and able to perform exercises - taking supplements that seemed to help and able to go to gym for 2 x a week. She noted that even with improvements , it was recommended he still follow-up with neurology as he was still having symptoms that were out of the ordinary for his body.  - 07/31/2021 visit patient had improved and was feeling better -he was taking b complex vitamin and melatonin which helped a lot with getting sleep.     Lauren reported that he had met goals and no further deficits requiring PT -graduated from PT- learned some stretches he can do at his desk  Today patient reports he feels much better overall. Has been feeling better since early august. No more numbness/tingling in his legs since that time. He stopped b complex 2-3 weeks ago as restarted MV (which has b12 in it). Felt like maybe was too much energy with restarting coffee - he took a long period without this to see if it would help. Taking D 3 daily 2000 units. Melatonin 3 mg very helpful- able to get back to sleep even if kids wake him up. Experimented with turning off pilot light- less gas smell seems to be helpful. Has picked up prayer more (very helpful). Back to going to gym. All has been helpful.   A/P: Paresthesias/fatigue/imbalance/leg weakness symptoms have resolved with better sleep, regular exercise, healthy  eating and vitamin supplementation.  Thrilled patient is doing so much better.  He would like to recheck his B12 levels which have been low (prefer over 400) as well as vitamin D (on vitamin D 2000 to 3000 units a day including multivitamin) to make sure his levels are not getting too high  Patient did have some underlying degenerative disc disease but not thought to be the origin of his symptoms.  No treatments are needed at this time   #hyperlipidemia but with no coronary artery calcium noted on CT angiogram 04/10/2021 S: Medication: None Lab Results  Component Value Date   CHOL 138 04/28/2021   HDL 29.90 (L) 04/28/2021   LDLCALC 94 04/12/2020   LDLDIRECT 85.0 04/28/2021   TRIG 212.0 (H) 04/28/2021   CHOLHDL 5 04/28/2021   A/P: Mild elevations in cholesterol-discussed importance of continued efforts for healthy eating/regular exercise.  With no coronary artery calcium scoring at age basically 40-would likely hold  on statin or repeat imaging at least until age 63 or 23 and could use up to 10 years from scan 04/10/2021  Recommended follow up: Return in about 6 months (around 02/24/2022) for physical or sooner if needed.  Lab/Order associations:   ICD-10-CM   1. Degenerative disc disease, lumbar  M51.36     2. Paresthesia of both lower extremities  R20.2     3. High risk medication use  Z79.899 VITAMIN D 25 Hydroxy (Vit-D Deficiency, Fractures)    4. Low serum vitamin B12  E53.8 Vitamin B12     Time Spent: 22 minutes of total time (11:40 AM- 12:02 PM) was spent on the date of the encounter performing the following actions: chart review prior to seeing the patient, obtaining history, performing a medically necessary exam, counseling on the treatment plan, placing orders, and documenting in our EHR.   I,Harris Phan,acting as a Education administrator for [name], MD.,have documented all relevant documentation on the behalf of [name], MD,as directed by  [name], MD while in the  presence of [name], MD.  I, [name], MD, have reviewed all documentation for this visit. The documentation on 08/26/21 for the exam, diagnosis, procedures, and orders are all accurate and complete.   Return precautions advised.  [name], MD

## 2021-10-05 ENCOUNTER — Encounter: Payer: Self-pay | Admitting: Family Medicine

## 2021-10-06 ENCOUNTER — Other Ambulatory Visit: Payer: Self-pay

## 2021-10-06 DIAGNOSIS — Z1211 Encounter for screening for malignant neoplasm of colon: Secondary | ICD-10-CM

## 2021-10-10 ENCOUNTER — Other Ambulatory Visit: Payer: Self-pay

## 2021-10-10 ENCOUNTER — Encounter (HOSPITAL_BASED_OUTPATIENT_CLINIC_OR_DEPARTMENT_OTHER): Payer: Self-pay | Admitting: *Deleted

## 2021-10-10 ENCOUNTER — Emergency Department (HOSPITAL_BASED_OUTPATIENT_CLINIC_OR_DEPARTMENT_OTHER): Payer: 59 | Admitting: Radiology

## 2021-10-10 ENCOUNTER — Emergency Department (HOSPITAL_BASED_OUTPATIENT_CLINIC_OR_DEPARTMENT_OTHER)
Admission: EM | Admit: 2021-10-10 | Discharge: 2021-10-10 | Disposition: A | Discharge status: Home / Self Care | Payer: 59 | Attending: Emergency Medicine | Admitting: Emergency Medicine

## 2021-10-10 DIAGNOSIS — Y9367 Activity, basketball: Secondary | ICD-10-CM | POA: Insufficient documentation

## 2021-10-10 DIAGNOSIS — W228XXA Striking against or struck by other objects, initial encounter: Secondary | ICD-10-CM | POA: Diagnosis not present

## 2021-10-10 DIAGNOSIS — M546 Pain in thoracic spine: Secondary | ICD-10-CM | POA: Diagnosis not present

## 2021-10-10 DIAGNOSIS — S20212A Contusion of left front wall of thorax, initial encounter: Secondary | ICD-10-CM | POA: Insufficient documentation

## 2021-10-10 DIAGNOSIS — M62838 Other muscle spasm: Secondary | ICD-10-CM | POA: Insufficient documentation

## 2021-10-10 DIAGNOSIS — S299XXA Unspecified injury of thorax, initial encounter: Secondary | ICD-10-CM | POA: Diagnosis present

## 2021-10-10 MED ORDER — METHOCARBAMOL 500 MG PO TABS: 1000.0000 mg | ORAL_TABLET | Freq: Four times a day (QID) | ORAL | 0 refills | Status: DC | PRN

## 2021-10-10 NOTE — ED Provider Notes (Signed)
[facility] DE[facility] Provider Note   CSN: [id number] Arrival date & time: 10/10/21  1250     History Chief Complaint  Patient presents with   Back Pain    Tanner [name] is a 50 y.o. male.  Patient presents emergency department for evaluation of left middle back and rib pain.  Symptoms started acutely 2 days ago when he fell while playing basketball with his kids.  He fell onto the base of the basketball goal, onto a metal bar.  He has intermittent, sharp stabbing pain, with certain positions and especially sitting up, standing up and walking.  When he lies flat or lies still, he does not really have any pain.  He has been taking ibuprofen.  No difficulty breathing or shortness of breath.  No referred pain to the shoulder.  No anterior abdominal pain.        Past Medical History:  Diagnosis Date   ALLERGIC RHINITIS 08/29/2007   BACK PAIN 01/08/2009   GASTROENTERITIS, ACUTE 01/08/2009   GERD 08/29/2007   HEARING LOSS 05/10/2008   ONYCHOMYCOSIS, TOENAILS 09/14/2008   Shingles outbreak 06/24/2011   SKIN LESION 10/28/2010    Patient Active Problem List   Diagnosis Date Noted   Exertional chest pain 04/12/2020   Degenerative disc disease, lumbar 05/24/2019   Erectile dysfunction 07/31/2014   Hypertriglyceridemia 10/06/2011   Onychomycosis 09/14/2008   Allergic rhinitis 08/29/2007   GERD 08/29/2007   CYST, PILONIDAL W/ABSCESS 08/29/2007    Past Surgical History:  Procedure Laterality Date   pilonidal cystectomy         Family History  Problem Relation Age of Onset   Lung cancer Mother 58       Long-term smoker   Coronary artery disease Father 74       Diagnosis 67-had CABG along with valve replacement;. 60 at death- MI complications. father was smoker   Valvular heart disease Father        rheumatic fever- replaced   Depression Other     Social History   Tobacco Use   Smoking status: Never   Smokeless tobacco: Never  Vaping Use   Vaping  Use: Never used  Substance Use Topics   Alcohol use: Yes    Alcohol/week: 6.0 standard drinks    Types: 6 Standard drinks or equivalent per week   Drug use: No    Home Medications Prior to Admission medications   Medication Sig Start Date End Date Taking? Authorizing Provider  cholecalciferol (VITAMIN D3) 25 MCG (1000 UNIT) tablet Take 1,000 Units by mouth daily.    [provider]  Fexofenadine HCl (ALLEGRA ALLERGY PO) Take by mouth.    [provider]  melatonin 3 MG TABS tablet Take 3 mg by mouth at bedtime.    [provider]  Multiple Vitamin (MULTIVITAMIN) capsule Take 1 capsule by mouth daily.    [provider]    Allergies    Patient has no known allergies.  Review of Systems   Review of Systems  Respiratory:  Negative for shortness of breath.   Cardiovascular:  Positive for chest pain (posterior ribs).  Gastrointestinal:  Negative for abdominal pain, constipation, nausea and vomiting.       Neg for fecal incontinence  Genitourinary:  Negative for difficulty urinating, flank pain and hematuria.       Negative for urinary incontinence or retention  Musculoskeletal:  Positive for back pain.  Neurological:  Negative for weakness and numbness.  Negative for saddle paresthesias    Physical Exam Updated Vital Signs BP 134/83 (BP Location: Left Arm)   Pulse 75   Temp 98 F (36.7 C)   Resp 18   Ht 5' 9"  (1.753 m)   Wt 96.2 kg   SpO2 99%   BMI 31.31 kg/m   Physical Exam Vitals and nursing note reviewed.  Constitutional:      Appearance: He is well-developed.  HENT:     Head: Normocephalic and atraumatic.  Eyes:     Conjunctiva/sclera: Conjunctivae normal.  Abdominal:     Palpations: Abdomen is soft.     Tenderness: There is no abdominal tenderness. There is no right CVA tenderness or left CVA tenderness.  Musculoskeletal:        General: Normal range of motion.     Cervical back: Normal range of motion. No tenderness.  Normal range of motion.     Thoracic back: Spasms and tenderness present.     Lumbar back: No tenderness.       Back:     Comments: No step-off noted with palpation of spine.  Patient is very tender over the inferior margin of the posterior ribs.  No ecchymosis.  No lower flank pain.  Occasionally he winces in pain with certain movements.  Skin:    General: Skin is warm and dry.  Neurological:     Mental Status: He is alert.     Sensory: No sensory deficit.     Motor: No abnormal muscle tone.     Deep Tendon Reflexes: Reflexes are normal and symmetric.     Comments: 5/5 strength in entire lower extremities bilaterally. No sensation deficit.   Psychiatric:        Mood and Affect: Mood normal.    ED Results / Procedures / Treatments   Labs (all labs ordered are listed, but only abnormal results are displayed) Labs Reviewed - No data to display  EKG None  Radiology DG Ribs Unilateral W/Chest Left  Result Date: 10/10/2021 CLINICAL DATA:  Fall playing basketball yesterday. Left rib and chest pain. Initial encounter. EXAM: LEFT RIBS AND CHEST - 3+ VIEW COMPARISON:  Chest radiograph on 03/21/2021 FINDINGS: No fracture or other bone lesions are seen involving the ribs. There is no evidence of pneumothorax or pleural effusion. Mild atelectasis is seen at the left lung base. Heart size and mediastinal contours are within normal limits. IMPRESSION: No evidence of left rib fracture or pneumothorax. Mild left basilar atelectasis. Electronically Signed   By: Marlaine Hind M.D.   On: 10/10/2021 18:12    Procedures Procedures   Medications Ordered in ED Medications - No data to display  ED Course  I have reviewed the triage vital signs and the nursing notes.  Pertinent labs & imaging results that were available during my care of the patient were reviewed by me and considered in my medical decision making (see chart for details).  Patient seen and examined.  No anterior abdominal pain.   Considered possibility of splenic injury given location of pain.  He does not have any significant bruising, shoulder pain.  Will evaluate with rib detail films.  Would likely benefit from patient of muscle relaxer and continued NSAIDs.  Vital signs reviewed and are as follows: BP 134/83 (BP Location: Left Arm)   Pulse 75   Temp 98 F (36.7 C)   Resp 18   Ht 5' 9"  (1.753 m)   Wt 96.2 kg   SpO2 99%   BMI  31.31 kg/m   6:41 PM patient updated on results.  He appears comfortable.  Plan for discharged home with heat on the area of pain, Robaxin trial, continued NSAIDs.  Encouraged return to the emergency department with worsening shortness of breath or trouble breathing, new concerns.  Encouraged patient to take 10 deep breaths every hour while awake to help fully inflate lungs.  He verbalizes understanding and agrees with plan.  Ready for discharge.  Patient counseled on proper use of muscle relaxant medication.  They were told not to drink alcohol, drive any vehicle, or do any dangerous activities while taking this medication.  Patient verbalized understanding.      MDM Rules/Calculators/A&P                           Patient with likely rib contusion after a fall 2 days ago.  Imaging is negative for rib fracture.  Cannot entirely rule out nondisplaced rib fracture, but do suspect contusion.  Lungs appear normal and patient is not in any respiratory distress.  Abdomen is soft and nontender.  No significant bruising.  Low concern for splenic injury.  We will continue symptomatic care as above.  Final Clinical Impression(s) / ED Diagnoses Final diagnoses:  Contusion of rib on left side, initial encounter  Muscle spasm    Rx / DC Orders ED Discharge Orders          Ordered    methocarbamol (ROBAXIN) 500 MG tablet  Every 6 hours PRN        10/10/21 1838             Carlisle Cater, PA-C 10/10/21 1842    Horton, Alvin Critchley, DO 10/10/21 2359

## 2021-10-10 NOTE — ED Notes (Signed)
Patient transported to X-ray 

## 2021-10-10 NOTE — ED Triage Notes (Signed)
Pt reports he fell playing basketball yesterday with his kids and landed (left side torso) on metal bar at bottom of basketball goal. C/o pain with certain movements

## 2021-10-10 NOTE — Discharge Instructions (Signed)
Please read and follow all provided instructions.  Your diagnoses today include:  1. Contusion of rib on left side, initial encounter   2. Muscle spasm     Tests performed today include: X-ray of the chest and ribs: No definite rib fractures, normal-appearing lungs Vital signs. See below for your results today.   Medications prescribed:  Robaxin (methocarbamol) - muscle relaxer medication  DO NOT drive or perform any activities that require you to be awake and alert because this medicine can make you drowsy.   Please use over-the-counter NSAID medications (ibuprofen, naproxen) as directed on the packaging for pain if you do not have any reasons not to take these medications just as weak kidneys or a history of bleeding in your stomach or gut.   Take any prescribed medications only as directed.  Home care instructions:  Follow any educational materials contained in this packet.  BE VERY CAREFUL not to take multiple medicines containing Tylenol (also called acetaminophen). Doing so can lead to an overdose which can damage your liver and cause liver failure and possibly death.   Follow-up instructions: Please follow-up with your primary care provider as needed for further evaluation of your symptoms.   Return instructions:  Please return to the Emergency Department if you experience worsening symptoms.  Please return if you have any other emergent concerns.  Additional Information:  Your vital signs today were: BP 128/81 (BP Location: Left Arm)   Pulse 74   Temp 98 F (36.7 C)   Resp 18   Ht 5' 9"  (1.753 m)   Wt 96.2 kg   SpO2 98%   BMI 31.31 kg/m  If your blood pressure (BP) was elevated above 135/85 this visit, please have this repeated by your doctor within one month. --------------

## 2021-10-13 ENCOUNTER — Telehealth: Payer: Self-pay

## 2021-10-13 NOTE — Telephone Encounter (Signed)
Patient was seen in ED on 11/25  Nurse Assessment Nurse: Sabra Heck, RN, [facility] Date/Time Eilene Ghazi Time): 10/08/2021 10:26:32 PM Confirm and document reason for call. If symptomatic, describe symptoms. ---Caller states he fell today while playing basketball, He bumped into pole on left lower ribcage on back. It happened around 2 pm and it is inhibiting his movement and is now having sharp pain in his ribs with movement from walking. If not moving he is not in pain.  Does the patient have any new or worsening symptoms? ---Yes Will a triage be completed? ---Yes Related visit to physician within the last 2 weeks? ---No Does the PT have any chronic conditions? (i.e. diabetes, asthma, this includes High risk factors for pregnancy, etc.) ---No Is this a behavioral health or substance abuse call? ---No  Chest Injury SEVERE chest pain Particia Nearing [facility] 10/08/2021 10:29:11 PM  10/08/2021 10:33:38 PM Go to ED Now Yes Sabra Heck, RN, [facility]

## 2021-10-14 NOTE — Telephone Encounter (Signed)
Noted  

## 2021-10-24 ENCOUNTER — Encounter: Payer: Self-pay | Admitting: Family Medicine

## 2021-11-14 LAB — COLOGUARD: COLOGUARD: NEGATIVE

## 2021-12-16 ENCOUNTER — Encounter: Payer: Self-pay | Admitting: Family Medicine

## 2021-12-17 ENCOUNTER — Encounter: Payer: Self-pay | Admitting: Family Medicine

## 2022-01-12 ENCOUNTER — Telehealth (INDEPENDENT_AMBULATORY_CARE_PROVIDER_SITE_OTHER): Payer: 59 | Admitting: Family Medicine

## 2022-01-12 ENCOUNTER — Other Ambulatory Visit: Payer: Self-pay

## 2022-01-12 ENCOUNTER — Encounter: Payer: Self-pay | Admitting: Family Medicine

## 2022-01-12 VITALS — Ht 69.0 in | Wt 210.0 lb

## 2022-01-12 DIAGNOSIS — M79601 Pain in right arm: Secondary | ICD-10-CM | POA: Diagnosis not present

## 2022-01-12 NOTE — Patient Instructions (Addendum)
There are no preventive care reminders to display for this patient.  Recommended follow up: Return for as needed for new, worsening, persistent symptoms.

## 2022-01-12 NOTE — Progress Notes (Signed)
Phone [phone] Virtual visit via Video note   Subjective:  Chief complaint: Chief Complaint  Patient presents with   pain in elbow    Pt c/o pain elbow I right elbow that started 2 do days ago. Pt has taken ibuprofen.    This visit type was conducted due to national recommendations for restrictions regarding the COVID-19 Pandemic (e.g. social distancing).  This format is felt to be most appropriate for this patient at this time balancing risks to patient and risks to population by having him in for in person visit.  No physical exam was performed (except for noted visual exam or audio findings with Telehealth visits).    Our team/I connected with [name] at  3:40 PM EST by a video enabled telemedicine application (doxy.me or caregility through epic) and verified that I am speaking with the correct person using two identifiers.  Location patient: [place] Location provider: [facility], office Persons participating in the virtual visit:  patient  Our team/I discussed the limitations of evaluation and management by telemedicine and the availability of in person appointments. In light of current covid-19 pandemic, patient also understands that we are trying to protect them by minimizing in office contact if at all possible.  The patient expressed consent for telemedicine visit and agreed to proceed. Patient understands insurance will be billed.   Past Medical History-  Patient Active Problem List   Diagnosis Date Noted   Hypertriglyceridemia 10/06/2011    Priority: Medium    Degenerative disc disease, lumbar 05/24/2019    Priority: Low   Erectile dysfunction 07/31/2014    Priority: Low   Allergic rhinitis 08/29/2007    Priority: Low   GERD 08/29/2007    Priority: Low   CYST, PILONIDAL W/ABSCESS 08/29/2007    Priority: Low   Exertional chest pain 04/12/2020   Onychomycosis 09/14/2008    Medications- reviewed and updated Current Outpatient Medications  Medication Sig  Dispense Refill   cholecalciferol (VITAMIN D3) 25 MCG (1000 UNIT) tablet Take 1,000 Units by mouth daily.     Fexofenadine HCl (ALLEGRA ALLERGY PO) Take by mouth.     melatonin 3 MG TABS tablet Take 3 mg by mouth at bedtime.     methocarbamol (ROBAXIN) 500 MG tablet Take 2 tablets (1,000 mg total) by mouth every 6 (six) hours as needed for muscle spasms. 30 tablet 0   Multiple Vitamin (MULTIVITAMIN) capsule Take 1 capsule by mouth daily.     No current facility-administered medications for this visit.     Objective:  Ht 5' 9"  (1.753 m)    Wt 210 lb (95.3 kg)    BMI 31.01 kg/m  self reported vitals Gen: NAD, resting comfortably Lungs: nonlabored, normal respiratory rate  Skin: appears dry, no obvious rash    Assessment and Plan    # Right arm pain S:2 days ago in the morning woke up with pain on inside of right elbow. Thought maybe slept on it awkwardly and it would go away. Did not go away and today woke up and was more tender to touch but doesn't visibly look different. Feels pain further around the elbow and down into forearm and into the bottom of bicep. Does not remember fall or injury. No weakness in the arm- if stretches arm feels a slightly pain. Pain level about 5-6/10. Took 3 mg of melatonin and wonders if he was really tired and laying on it heavily. Was able to sleep last night. Not painful at rest. No  redness of swelling in the area- looks identical to the other arm. Has tried heat but not ice yet. No pain with wrist movement.   Took down a trampoline and put it back up yesterday without issue- was not restricted by the pain  No neck shoulder or back pain.  A/P: No visible change to the arm with no swelling or redness-doubt infectious cause such as cellulitis or vascular issue such as DVT or phlebitis.  I think this is most likely musculoskeletal and/or nerve irritation injury from sleeping heavily on the arm-we discussed if not improving with icing 3 times a day for 10 to 15  minutes to consider further steps-depending on appearance by Wednesday would consider sports medicine or having him in the office to evaluate further.  Doubt related to cervical disc issues-no paresthesias reported.  Also doubt cardiac issues-prior CT angiogram showing no coronary artery calcium also encouraging  Recommended follow up: I asked him to update [place] on Wednesday and to report sooner if new or worsening symptoms  Lab/Order associations:   ICD-10-CM   1. Right arm pain  M79.601      Time Spent: 22 minutes of total time (4 PM- 4:22 PM) was spent on the date of the encounter performing the following actions: chart review prior to seeing the patient, obtaining history, performing a medically necessary exam, counseling on the treatment plan, placing orders, and documenting in our EHR.   I,Jada Bradford,acting as a scribe for [name], MD.,have documented all relevant documentation on the behalf of [name], MD,as directed by  [name], MD while in the presence of [name], MD.   I, [name], MD, have reviewed all documentation for this visit. The documentation on 01/12/22 for the exam, diagnosis, procedures, and orders are all accurate and complete.   Return precautions advised.  [name], MD

## 2022-01-13 ENCOUNTER — Encounter: Payer: Self-pay | Admitting: Family Medicine

## 2022-01-13 NOTE — Telephone Encounter (Signed)
See note

## 2022-01-15 ENCOUNTER — Other Ambulatory Visit: Payer: Self-pay

## 2022-01-15 DIAGNOSIS — M79601 Pain in right arm: Secondary | ICD-10-CM

## 2022-01-16 ENCOUNTER — Other Ambulatory Visit: Payer: Self-pay

## 2022-01-16 DIAGNOSIS — M79601 Pain in right arm: Secondary | ICD-10-CM

## 2022-01-16 NOTE — Progress Notes (Signed)
? ? ?  Subjective:   ? ?CC: R elbow pain ? ?I, Wendy Poet, LAT, ATC, am serving as scribe for Dr. Lynne Leader. ? ?HPI: Pt is a 51 y/o male c/o R elbow pain since 01/10/22 w/ no known MOI. Pt woke up in the morning w/ pain along the proximal and distal medial aspect of the R elbow. Pt notes the pain has somewhat improved, but he notes an "itchy" sensation over the area.  He locates his pain to medial aspect of his upper arm, medial aspect of the R elbow, and medial proximal forearm. Pt note TTP over the area, but that the pain doesn't limit his activity. ? ?No recent blood draws or trauma to this area. ? ?Radiating pain: no ?R elbow swelling: yes ?Aggravating factors: tenderness to palpation;  ?Treatments tried: heat, IBU ? ?Pertinent review of Systems: No fevers or chills.  Chest pain or palpitations or shortness of breath.  ? ?Relevant historical information: No history of DVT. ? ? ?Objective:   ? ?Vitals:  ? 01/19/22 1107  ?BP: 128/80  ?Pulse: 76  ?SpO2: 98%  ? ?General: Well Developed, well nourished, and in no acute distress.  ? ?MSK: Right elbow: Normal-appearing ?Tender palpation at medial elbow.  Palpable cord along medial elbow and medial upper arm. ?Normal elbow motion. ?Normal elbow strength. ?No pain with resisted elbow flexion elbow supination or pronation. ?No pain with resisted wrist flexion. ?Pulses cap refill and sensation are intact distally. ? ?Lab and Radiology Results ? ?Diagnostic Limited MSK Ultrasound of: Right medial elbow ?Area of tenderness reveals a noncollapsible vein at medial elbow consistent in appearance with a superficial thrombophlebitis. ?Impression: Superficial thrombophlebitis medial right elbow. ? ? ? ? ?Impression and Recommendations:   ? ?Assessment and Plan: ?51 y.o. male with right medial elbow pain thought to be superficial thrombophlebitis.  It does track proximally up the arm a bit.  Plan to treat conservatively with compression topical NSAIDs and heat.  However will  obtain a more formal diagnostic vascular ultrasound study to quantify how proximally the superficial thrombophlebitis tracks.  If it approaches the deep vein system he may need to be on blood thinners. ?The current understanding of of when to anticoagulate with SVT is unfortunately based more on the lower extremity superficial venous thrombosis. ?Would recommend anticoagulation if SVT proximity 3-5 cm to the deep venous system or if it worsens with symptomatic care. ?I think this is generally a reasonably good guideline to follow for the upper extremity as well however there is less evidence. ? ?PDMP not reviewed this encounter. ?Orders Placed This Encounter  ?Procedures  ? [place] LIMITED JOINT SPACE STRUCTURES UP RIGHT(NO LINKED CHARGES)  ?  Order Specific Question:   Reason for Exam (SYMPTOM  OR DIAGNOSIS REQUIRED)  ?  Answer:   R elbow pain  ?  Order Specific Question:   Preferred imaging location?  ?  Answer:   [place]  ? ?No orders of the defined types were placed in this encounter. ? ? ?Discussed warning signs or symptoms. Please see discharge instructions. Patient expresses understanding. ? ? ?The above documentation has been reviewed and is accurate and complete Lynne Leader, M.D. ? ?

## 2022-01-19 ENCOUNTER — Ambulatory Visit: Payer: 59 | Admitting: Family Medicine

## 2022-01-19 ENCOUNTER — Other Ambulatory Visit: Payer: Self-pay

## 2022-01-19 ENCOUNTER — Ambulatory Visit: Payer: Self-pay

## 2022-01-19 VITALS — BP 128/80 | HR 76 | Ht 69.0 in | Wt 219.6 lb

## 2022-01-19 DIAGNOSIS — I808 Phlebitis and thrombophlebitis of other sites: Secondary | ICD-10-CM | POA: Diagnosis not present

## 2022-01-19 DIAGNOSIS — M25521 Pain in right elbow: Secondary | ICD-10-CM

## 2022-01-19 NOTE — Patient Instructions (Addendum)
Thank you for coming in today.  ? ?You have superficial thrombophlebitis. ? ?I recommend you obtained a compression sleeve to help with your joint problems. There are many options on the market however I recommend obtaining a elbow Body Helix compression sleeve.  You can find information (including how to appropriate measure yourself for sizing) can be found at www.Body http://www.lambert.com/.  Many of these products are health savings account (HSA) eligible.  ? ?You can use the compression sleeve at any time throughout the day but is most important to use while being active as well as for 2 hours post-activity.   It is appropriate to ice following activity with the compression sleeve in place.  ? ?Please use Voltaren gel (Generic Diclofenac Gel) up to 4x daily for pain as needed.  This is available over-the-counter as both the name brand Voltaren gel and the generic diclofenac gel.  ? ?I've ordered a venous doppler US.  Their office will call you to schedule. ? ?Follow-up as needed. ?

## 2022-01-26 ENCOUNTER — Other Ambulatory Visit: Payer: Self-pay

## 2022-01-26 ENCOUNTER — Telehealth: Payer: Self-pay | Admitting: Family Medicine

## 2022-01-26 ENCOUNTER — Ambulatory Visit (HOSPITAL_COMMUNITY)
Admission: RE | Admit: 2022-01-26 | Discharge: 2022-01-26 | Disposition: A | Discharge status: Home / Self Care | Payer: 59 | Source: Ambulatory Visit | Attending: Cardiovascular Disease | Admitting: Cardiovascular Disease

## 2022-01-26 ENCOUNTER — Encounter (HOSPITAL_COMMUNITY): Payer: Self-pay

## 2022-01-26 DIAGNOSIS — I808 Phlebitis and thrombophlebitis of other sites: Secondary | ICD-10-CM | POA: Diagnosis present

## 2022-01-26 DIAGNOSIS — I82622 Acute embolism and thrombosis of deep veins of left upper extremity: Secondary | ICD-10-CM

## 2022-01-26 MED ORDER — APIXABAN 5 MG PO TABS: 5.0000 mg | ORAL_TABLET | Freq: Two times a day (BID) | ORAL | 2 refills | Status: DC

## 2022-01-26 MED ORDER — APIXABAN 5 MG PO TABS: ORAL_TABLET | ORAL | 0 refills | Status: DC

## 2022-01-26 NOTE — Telephone Encounter (Signed)
Answered patients questions. Pain has resolved. Reports never had swelling.  ? ?Will do minimum 3 month eliquis ? ?Refer to hematology for thrombophilia workup due to higher likelihood of that with UE DVT. No clear provoking cause other than sleep position that one night- possible? ? ?Thankful for DR. Corey's care!  ?

## 2022-01-26 NOTE — Telephone Encounter (Signed)
Patient was seen over at [facility]- patient is to be placed on a blood thinner and wants to touch base with dr hunter- patient would like a call back from dr hunter -  ?

## 2022-01-26 NOTE — Progress Notes (Signed)
Right upper extremity venous duplex test performed. There is evidence of deep and superficial thrombosis in the right upper extremity veins. See results under Chart Review-CV proc. Called preliminary report to Dr. Georgina Snell, patient given instructions to pick up prescription at his pharmacy. Final report to follow.  ?

## 2022-01-26 NOTE — Telephone Encounter (Signed)
According to the vascular lab tech the superficial thrombosis does appear to connect to the deep vein system so this should be treated like a deep vein thrombosis for now.  I am sending in Eliquis which is a blood thinner.  Start taking it now. ?

## 2022-01-27 ENCOUNTER — Encounter: Payer: Self-pay | Admitting: Family Medicine

## 2022-01-27 NOTE — Telephone Encounter (Signed)
Pt aware of results per Dr. Georgina Snell. ?

## 2022-01-27 NOTE — Progress Notes (Signed)
The superficial clot does connect with the deep vein system so we should treat with anticoagulation.  I have sent in a blood thinner to the pharmacy.

## 2022-01-28 ENCOUNTER — Telehealth: Payer: Self-pay | Admitting: Oncology

## 2022-01-28 NOTE — Telephone Encounter (Signed)
Called 1X, attempted to contact patient to schedule visit but no answer. Left voicemail for patient to call us back. ?

## 2022-01-30 ENCOUNTER — Telehealth: Payer: Self-pay | Admitting: Oncology

## 2022-01-30 NOTE — Telephone Encounter (Signed)
Called 2X, attempted to contact patient to schedule visit but no answer. Left voicemail for patient to call us back. ?

## 2022-02-09 NOTE — Progress Notes (Signed)
? ?Phone [phone] ?Virtual visit via Video note ?  ?Subjective:  ?Chief complaint: ?Chief Complaint  ?Patient presents with  ? Eliquis  ?  Pt would like to discuss eliquis and things he can and cant do while on this medication.  ? ? ?This visit type was conducted due to national recommendations for restrictions regarding the COVID-19 Pandemic (e.g. social distancing).  This format is felt to be most appropriate for this patient at this time balancing risks to patient and risks to population by having him in for in person visit.  No physical exam was performed (except for noted visual exam or audio findings with Telehealth visits).   ? ?Our team/I connected with [name] at  2:20 PM EDT by a video enabled telemedicine application (doxy.me or caregility through epic) and verified that I am speaking with the correct person using two identifiers.  ?Location patient: [place] ?Location provider: [facility], office ?Persons participating in the virtual visit:  patient ? ?Our team/I discussed the limitations of evaluation and management by telemedicine and the availability of in person appointments. In light of current covid-19 pandemic, patient also understands that we are trying to protect them by minimizing in office contact if at all possible.  The patient expressed consent for telemedicine visit and agreed to proceed. Patient understands insurance will be billed.  ? ?Past Medical History-  ?Patient Active Problem List  ? Diagnosis Date Noted  ? Acute deep vein thrombosis (DVT) of left upper extremity ([facility]) 01/26/2022  ?  Priority: High  ? Hypertriglyceridemia 10/06/2011  ?  Priority: Medium   ? Degenerative disc disease, lumbar 05/24/2019  ?  Priority: Low  ? Erectile dysfunction 07/31/2014  ?  Priority: Low  ? Allergic rhinitis 08/29/2007  ?  Priority: Low  ? GERD 08/29/2007  ?  Priority: Low  ? CYST, PILONIDAL W/ABSCESS 08/29/2007  ?  Priority: Low  ? Superficial thrombophlebitis of right upper extremity  01/19/2022  ? Exertional chest pain 04/12/2020  ? Onychomycosis 09/14/2008  ? ? ?Medications- reviewed and updated ?Current Outpatient Medications  ?Medication Sig Dispense Refill  ? apixaban (ELIQUIS) 5 MG TABS tablet Take 2 tablets (47m) twice daily for 7 days, then 1 tablet (548m twice daily 60 tablet 0  ? apixaban (ELIQUIS) 5 MG TABS tablet Take 1 tablet (5 mg total) by mouth 2 (two) times daily. After finishing prescription from Dr. CoGeorgina Snell0 tablet 2  ? cholecalciferol (VITAMIN D3) 25 MCG (1000 UNIT) tablet Take 1,000 Units by mouth daily.    ? Fexofenadine HCl (ALLEGRA ALLERGY PO) Take by mouth.    ? melatonin 3 MG TABS tablet Take 3 mg by mouth at bedtime.    ? Multiple Vitamin (MULTIVITAMIN) capsule Take 1 capsule by mouth daily.    ? ?No current facility-administered medications for this visit.  ? ?  ?Objective:  ?Ht 5' 9"  (1.753 m)   Wt 215 lb (97.5 kg)   BMI 31.75 kg/m?  self reported vitals ?Gen: NAD, resting comfortably ?Lungs: nonlabored, normal respiratory rate  ?Skin: appears dry, no obvious rash ? ?  ? ?Assessment and Plan  ? ?# Right arm DVT ?S: started on eliquis 01/26/22 after DVT was discovered. So far not having any issues with the medicine. He has finished high dose 10 mg BID for 7 days- down to 5 mg twice a day now. No issues with bleeding. Has been hit a few times outdoors and no major issues.  ?- once again this was within a  month after covid (had in February) hat he developed this and laid awkwardly/heavily on his arm overnight.  ? ?Today patient reports no pain or swelling in the arm ?A/P: Patient on appropriate treatment for right arm DVT-has upcoming appointment with hematology to consider hypercoagulable work-up-clot did occur within a month of COVID and I wonder if that would be the provoking factor ?- Appears to be stable/improving-we opted to continue Eliquis for minimum of 3 months but certainly open to Dr. Gearldine Shown opinion on if we should prolong further ? ?There are no  preventive care reminders to display for this patient. ? ?Recommended follow up:  ?Future Appointments  ?Date Time Provider [place]  ?03/04/2022 10:20 AM Ladell Pier, MD [facility] None  ? ? ?Lab/Order associations: ?  ICD-10-CM   ?1. Acute deep vein thrombosis (DVT) of left upper extremity, unspecified vein (H[facility])  I82.622   ?  ? ? ?No orders of the defined types were placed in this encounter. ? ?I,Jada Bradford,acting as a scribe for [name], MD.,have documented all relevant documentation on the behalf of [name], MD,as directed by  [name], MD while in the presence of [name], MD. ? ? I, [name], MD, have reviewed all documentation for this visit. The documentation on 02/10/22 for the exam, diagnosis, procedures, and orders are all accurate and complete.  ? ?Return precautions advised.  ?[name], MD ? ? ?

## 2022-02-10 ENCOUNTER — Telehealth (INDEPENDENT_AMBULATORY_CARE_PROVIDER_SITE_OTHER): Payer: 59 | Admitting: Family Medicine

## 2022-02-10 ENCOUNTER — Encounter: Payer: Self-pay | Admitting: Family Medicine

## 2022-02-10 VITALS — Ht 69.0 in | Wt 215.0 lb

## 2022-02-10 DIAGNOSIS — I82622 Acute embolism and thrombosis of deep veins of left upper extremity: Secondary | ICD-10-CM

## 2022-03-04 ENCOUNTER — Inpatient Hospital Stay: Payer: 59 | Attending: Oncology | Admitting: Oncology

## 2022-03-04 VITALS — BP 140/90 | HR 88 | Temp 98.1°F | Resp 18 | Ht 69.0 in | Wt 218.4 lb

## 2022-03-04 DIAGNOSIS — I82A11 Acute embolism and thrombosis of right axillary vein: Secondary | ICD-10-CM | POA: Diagnosis not present

## 2022-03-04 DIAGNOSIS — Z8616 Personal history of COVID-19: Secondary | ICD-10-CM | POA: Diagnosis not present

## 2022-03-04 NOTE — Progress Notes (Addendum)
?[facility] ?New Patient Consult ? ? ?Requesting MD: ?[name], Md ?[place] ?[name],  [name] ? ? ?[name] ?51 y.o.  ?1971-10-30 ? ?  ?Reason for Consult: Right arm DVT ? ? ?HPI: [place] [name] reports the acute onset of right arm pain near the medial aspect of the elbow joint beginning 01/10/2022.  No arm erythema or swelling.  He reports the skin and soft tissue at the medial right arm felt firm.  He saw [name] on 01/19/2022 and a limited ultrasound revealed superficial thrombophlebitis at the medial right elbow. ?He was referred for a Doppler of the right upper extremity 01/26/2022.  This revealed acute deep vein thrombosis of the axillary vein in addition to occlusive superficial thrombophlebitis of the right basilic vein.  He was placed on apixaban anticoagulation. ? ?He reports the arm pain improved within 1 week of beginning apixaban.  He has no pain at present.  He has no previous history of venous or arterial thromboembolic disease. ?[name] reports having COVID-19 infection in early February.  He had upper airway congestion, fatigue, and body aches.  The symptoms have resolved.  Was negative for pulmonary embolism.  There was a trace left apical pneumothorax with possible trace paramediastinal and paraseptal emphysematous changes. ? ?He had no trauma, surgery, or prolonged immobility prior to the DVT diagnosis.  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 ? ?He had COVID-19 in January 22.  In the spring 2022 he developed tingling in both legs, palpitations, anxiety, and insomnia.  He underwent an extensive diagnostic evaluation including a negative brain MRI, unremarkable lumbar spine MRI, and CT of the chest.  The chest CT was negative for pulmonary embolism.  There was a trace apical pneumothorax and a question of paramediastinal emphysematous change.  The chest CT was obtained on 04/09/2021 when he presented to emergency  room with chest discomfort. ? ?He reports his symptoms completely resolved when he began melatonin, vitamin D, and vitamin B12 therapy. ? ?Past Medical History:  ?Diagnosis Date  ? ALLERGIC RHINITIS 08/29/2007  ? BACK PAIN 01/08/2009  ? GASTROENTERITIS, ACUTE 01/08/2009  ? GERD 08/29/2007  ? HEARING LOSS 05/10/2008  ? ONYCHOMYCOSIS, TOENAILS 09/14/2008  ? Shingles outbreak 06/24/2011  ? SKIN LESION 10/28/2010  ? ? ?Past Surgical History:  ?Procedure Laterality Date  ? pilonidal cystectomy    ? ? ?Medications: Reviewed ? ?Allergies: No Known Allergies ? ?Family history: His mother had lung cancer and was a smoker.  No family history of venous or arterial thromboembolic disease ? ?Social History:  ? ?He lives with his wife and children in [place].  He works in Librarian, academic.  He does not use cigarettes.  He reports social alcohol use.  No transfusion history.  No risk factor for HIV or hepatitis. ? ?ROS:  ? ?Positives include: Right arm pain February 2023-resolved ? ?A complete ROS was otherwise negative. ? ?Physical Exam: ? ?Blood pressure 140/90, pulse 88, temperature 98.1 ?F (36.7 ?C), temperature source Oral, resp. rate 18, height 5' 9"  (1.753 m), weight 218 lb 6.4 oz (99.1 kg), SpO2 99 %. ? ?HEENT: Oropharynx without visible mass, neck without mass ?Lungs: Clear bilaterally ?Cardiac: Regular rate and rhythm ?Abdomen: No hepatosplenomegaly, nontender, no mass  ?Vascular: No leg edema ?Lymph nodes: No cervical, supraclavicular, axillary, or inguinal nodes ?Neurologic: Alert and oriented, the motor exam appears intact in the upper and lower extremities bilaterally ?Skin: No  rash ?Musculoskeletal: No spine tenderness ? ? ?LAB: ? ?CBC ? ?Lab Results  ?Component Value Date  ? WBC 6.5 04/28/2021  ? HGB 16.1 04/28/2021  ? HCT 48.1 04/28/2021  ? MCV 89.0 04/28/2021  ? PLT 247.0 04/28/2021  ? NEUTROABS 3.9 04/28/2021  ?  ? ?  ? ?CMP  ?Lab Results  ?Component Value Date  ? NA 138 04/28/2021  ? K 4.4 04/28/2021  ? CL  104 04/28/2021  ? CO2 27 04/28/2021  ? GLUCOSE 89 04/28/2021  ? BUN 9 04/28/2021  ? CREATININE 1.00 04/28/2021  ? CALCIUM 9.0 04/28/2021  ? PROT 7.3 04/28/2021  ? ALBUMIN 4.3 04/28/2021  ? AST 27 04/28/2021  ? ALT 32 04/28/2021  ? ALKPHOS 101 04/28/2021  ? BILITOT 0.8 04/28/2021  ? GFRNONAA >60 04/09/2021  ? GFRAA >60 05/17/2015  ? ? ? ? ? ? ?Assessment/Plan:  ? ?Acute right axillary DVT and right basilic vein superficial thrombosis 01/26/2022 ?Right arm pain beginning 01/10/2022 ?Apixaban anticoagulation ?COVID-19 infection February 2023-home COVID test ? ? ? ?Disposition:  ? ?Mr [name] is referred for hematology evaluation after being diagnosed with a deep vein thrombosis of the right upper extremity last month.  He is taking apixaban anticoagulation. ? ?His only apparent risk factor for venous thrombosis is recent COVID-19 infection.  He had no recent trauma, surgery, prolonged immobility, or previous history of venous thrombosis.  A potential contributing factor is his sedentary job. ? ?He had a negative Cologuard and multiple negative imaging studies in 2022. ? ?[place] [name] appears to have a provoked right upper extremity DVT related to COVID-19 infection.  I recommend a 55-monthcourse of anticoagulation therapy. ? ?We discussed the indication for obtaining a hypercoagulation panel.  He does not have a history to suggest an inherited or another acquired condition associated with venous thrombosis.  He is comfortable with no additional diagnostic testing. ? ?He plans to continue clinical follow-up with Dr. HYong Channel  I am available to see him in the future as needed. ? ?GBetsy Coder MD  ?03/04/2022, 10:37 AM ? ? ? ?

## 2022-03-05 NOTE — Progress Notes (Signed)
Pt was called to confirm where he did his covid test in February. Pt confirmed he took a home covid test.  ?

## 2022-03-06 ENCOUNTER — Other Ambulatory Visit: Payer: Self-pay | Admitting: Family Medicine

## 2022-03-09 ENCOUNTER — Encounter: Payer: Self-pay | Admitting: Family Medicine

## 2022-03-09 ENCOUNTER — Other Ambulatory Visit: Payer: Self-pay

## 2022-03-09 MED ORDER — APIXABAN 5 MG PO TABS: ORAL_TABLET | ORAL | 0 refills | Status: DC

## 2022-03-24 ENCOUNTER — Encounter: Payer: Self-pay | Admitting: Family Medicine

## 2022-04-08 ENCOUNTER — Encounter: Payer: Self-pay | Admitting: Family Medicine

## 2022-04-08 ENCOUNTER — Telehealth (INDEPENDENT_AMBULATORY_CARE_PROVIDER_SITE_OTHER): Payer: 59 | Admitting: Family Medicine

## 2022-04-08 VITALS — Ht 69.0 in | Wt 214.0 lb

## 2022-04-08 DIAGNOSIS — I82622 Acute embolism and thrombosis of deep veins of left upper extremity: Secondary | ICD-10-CM

## 2022-04-08 NOTE — Patient Instructions (Addendum)
There are no preventive care reminders to display for this patient.  Recommended follow up: Return in about 4 months (around 08/09/2022) for physical or sooner if needed.Schedule b4 you leave.

## 2022-04-08 NOTE — Progress Notes (Signed)
Phone [phone] Virtual visit via Video note   Subjective:  Chief complaint: Chief Complaint  Patient presents with   Follow-up    Pt wants to f/u on eliquis.     This visit type was conducted due to national recommendations for restrictions regarding the COVID-19 Pandemic (e.g. social distancing).  This format is felt to be most appropriate for this patient at this time balancing risks to patient and risks to population by having him in for in person visit.  No physical exam was performed (except for noted visual exam or audio findings with Telehealth visits).    Our team/I connected with [name] at  9:00 AM EDT by a video enabled telemedicine application (doxy.me or caregility through epic) and verified that I am speaking with the correct person using two identifiers.  Location patient: [place] Location provider: [facility], office Persons participating in the virtual visit:  patient  Our team/I discussed the limitations of evaluation and management by telemedicine and the availability of in person appointments. In light of current covid-19 pandemic, patient also understands that we are trying to protect them by minimizing in office contact if at all possible.  The patient expressed consent for telemedicine visit and agreed to proceed. Patient understands insurance will be billed.   Past Medical History-  Patient Active Problem List   Diagnosis Date Noted   Acute deep vein thrombosis (DVT) of left upper extremity (HCC) 01/26/2022    Priority: High   Hypertriglyceridemia 10/06/2011    Priority: Medium    Degenerative disc disease, lumbar 05/24/2019    Priority: Low   Erectile dysfunction 07/31/2014    Priority: Low   Allergic rhinitis 08/29/2007    Priority: Low   GERD 08/29/2007    Priority: Low   CYST, PILONIDAL W/ABSCESS 08/29/2007    Priority: Low   Superficial thrombophlebitis of right upper extremity 01/19/2022   Exertional chest pain 04/12/2020    Onychomycosis 09/14/2008    Medications- reviewed and updated Current Outpatient Medications  Medication Sig Dispense Refill   apixaban (ELIQUIS) 5 MG TABS tablet Take 2 tablets (17m) twice daily for 7 days, then 1 tablet (518m twice daily 60 tablet 0   cholecalciferol (VITAMIN D3) 25 MCG (1000 UNIT) tablet Take 1,000 Units by mouth daily.     Fexofenadine HCl (ALLEGRA ALLERGY PO) Take by mouth.     melatonin 3 MG TABS tablet Take 3 mg by mouth at bedtime.     Multiple Vitamin (MULTIVITAMIN) capsule Take 1 capsule by mouth daily.     No current facility-administered medications for this visit.     Objective:  Ht 5' 9"  (1.753 m)   Wt 214 lb (97.1 kg)   BMI 31.60 kg/m  self reported vitals Gen: NAD, resting comfortably Lungs: nonlabored, normal respiratory rate  Skin: appears dry, no obvious rash     Assessment and Plan   # Right upper extremity DVT S:he is upcoming 3 months of therapy- started June 13th with eliquis 5 mg BID -saw Dr. ShBenay Spicedecision was made not to pursue coagulation workup - thought provoked related to covid 19 infection -no significant side effects thankfully A/P: Overall stable- ending near of therapy. Thought provoked- 3 months therapy total- finish course of eliquis 5 mg BID  - discussed signs to look for as coming off of therapy  Recommended follow up: Return in about 4 months (around 08/09/2022) for physical or sooner if needed.Schedule b4 you leave.  Lab/Order associations:   ICD-10-CM  1. Acute deep vein thrombosis (DVT) of left upper extremity, unspecified vein (HCC)  I82.622      No orders of the defined types were placed in this encounter.  Return precautions advised.  [name], MD

## 2022-06-09 ENCOUNTER — Ambulatory Visit (INDEPENDENT_AMBULATORY_CARE_PROVIDER_SITE_OTHER): Payer: 59 | Admitting: Family Medicine

## 2022-06-09 ENCOUNTER — Encounter: Payer: Self-pay | Admitting: Family Medicine

## 2022-06-09 VITALS — BP 120/80 | HR 77 | Temp 98.3°F | Ht 69.0 in | Wt 221.8 lb

## 2022-06-09 DIAGNOSIS — Z0000 Encounter for general adult medical examination without abnormal findings: Secondary | ICD-10-CM | POA: Diagnosis not present

## 2022-06-09 DIAGNOSIS — E781 Pure hyperglyceridemia: Secondary | ICD-10-CM

## 2022-06-09 LAB — COMPREHENSIVE METABOLIC PANEL
ALT: 24 U/L (ref 0–53)
AST: 26 U/L (ref 0–37)
Albumin: 4.5 g/dL (ref 3.5–5.2)
Alkaline Phosphatase: 114 U/L (ref 39–117)
BUN: 11 mg/dL (ref 6–23)
CO2: 27 mEq/L (ref 19–32)
Calcium: 9.5 mg/dL (ref 8.4–10.5)
Chloride: 105 mEq/L (ref 96–112)
Creatinine, Ser: 0.92 mg/dL (ref 0.40–1.50)
GFR: 97.47 mL/min (ref >60.00)
Glucose, Bld: 83 mg/dL (ref 70–99)
Potassium: 4.6 mEq/L (ref 3.5–5.1)
Sodium: 142 mEq/L (ref 135–145)
Total Bilirubin: 0.6 mg/dL (ref 0.2–1.2)
Total Protein: 7.6 g/dL (ref 6.0–8.3)

## 2022-06-09 LAB — CBC WITH DIFFERENTIAL/PLATELET
Basophils Absolute: 0.1 10*3/uL (ref 0.0–0.1)
Basophils Relative: 0.9 % (ref 0.0–3.0)
Eosinophils Absolute: 0.2 10*3/uL (ref 0.0–0.7)
Eosinophils Relative: 2 % (ref 0.0–5.0)
HCT: 47 % (ref 39.0–52.0)
Hemoglobin: 15.9 g/dL (ref 13.0–17.0)
Lymphocytes Relative: 28 % (ref 12.0–46.0)
Lymphs Abs: 2.3 10*3/uL (ref 0.7–4.0)
MCHC: 33.8 g/dL (ref 30.0–36.0)
MCV: 89.9 fl (ref 78.0–100.0)
Monocytes Absolute: 0.5 10*3/uL (ref 0.1–1.0)
Monocytes Relative: 6.8 % (ref 3.0–12.0)
Neutro Abs: 5 10*3/uL (ref 1.4–7.7)
Neutrophils Relative %: 62.3 % (ref 43.0–77.0)
Platelets: 203 10*3/uL (ref 150.0–400.0)
RBC: 5.23 Mil/uL (ref 4.22–5.81)
RDW: 13.8 % (ref 11.5–15.5)
WBC: 8 10*3/uL (ref 4.0–10.5)

## 2022-06-09 LAB — LIPID PANEL
Cholesterol: 186 mg/dL (ref 0–200)
HDL: 42.1 mg/dL (ref >39.00)
LDL Cholesterol: 114 mg/dL — ABNORMAL HIGH (ref 0–99)
NonHDL: 143.92
Total CHOL/HDL Ratio: 4
Triglycerides: 149 mg/dL (ref 0.0–149.0)
VLDL: 29.8 mg/dL (ref 0.0–40.0)

## 2022-06-09 NOTE — Progress Notes (Signed)
Phone: [phone]    Subjective:  Patient presents today for their annual physical. Chief complaint-noted.   See problem oriented charting- ROS- full  review of systems was completed and negative  Per full ROS sheet completed by patient  The following were reviewed and entered/updated in epic: Past Medical History:  Diagnosis Date   ALLERGIC RHINITIS 08/29/2007   BACK PAIN 01/08/2009   GASTROENTERITIS, ACUTE 01/08/2009   GERD 08/29/2007   HEARING LOSS 05/10/2008   ONYCHOMYCOSIS, TOENAILS 09/14/2008   Shingles outbreak 06/24/2011   SKIN LESION 10/28/2010   Patient Active Problem List   Diagnosis Date Noted   Acute deep vein thrombosis (DVT) of left upper extremity (HCC) 01/26/2022    Priority: High   Hypertriglyceridemia 10/06/2011    Priority: Medium    Degenerative disc disease, lumbar 05/24/2019    Priority: Low   Erectile dysfunction 07/31/2014    Priority: Low   Onychomycosis 09/14/2008    Priority: Low   Allergic rhinitis 08/29/2007    Priority: Low   GERD 08/29/2007    Priority: Low   CYST, PILONIDAL W/ABSCESS 08/29/2007    Priority: Low   Exertional chest pain 04/12/2020   Past Surgical History:  Procedure Laterality Date   pilonidal cystectomy      Family History  Problem Relation Age of Onset   Lung cancer Mother 50       Long-term smoker   Coronary artery disease Father 35       Diagnosis 67-had CABG along with valve replacement;. 77 at death- MI complications. father was smoker   Valvular heart disease Father        rheumatic fever- replaced   Depression Other     Medications- reviewed and updated Current Outpatient Medications  Medication Sig Dispense Refill   cholecalciferol (VITAMIN D3) 25 MCG (1000 UNIT) tablet Take 1,000 Units by mouth daily.     No current facility-administered medications for this visit.    Allergies-reviewed and updated No Known Allergies  Social History   Social History Narrative   Married 2008 with 5 kids  (12, 11, 8, 5, 2 in 2022).       Works as Air cabin crew Environmental manager) at [employer]- all day long in front of computer. Works with Haematologist like Art therapist      Hobbies: time with kids at parks, playing in yard      Enjoys working out the [employer] lifting.=> He enjoys weightlifting more than cardio.   He also does walk on the treadmill.      Objective:  BP 120/80   Pulse 77   Temp 98.3 F (36.8 C)   Ht 5\' 9"  (1.753 m)   Wt 221 lb 12.8 oz (100.6 kg)   SpO2 98%   BMI 32.75 kg/m  Gen: NAD, resting comfortably HEENT: Mucous membranes are moist. Oropharynx normal Neck: no thyromegaly CV: RRR no murmurs rubs or gallops Lungs: CTAB no crackles, wheeze, rhonchi Abdomen: soft/nontender/nondistended/normal bowel sounds. No rebound or guarding.  Ext: no edema Skin: warm, dry Neuro: grossly normal, moves all extremities, PERRLA    Assessment and Plan:  51 y.o. male presenting for annual physical.  Health Maintenance counseling: 1. Anticipatory guidance: Patient counseled regarding regular dental exams -q6 months, eye exams - yearly,  avoiding smoking and second hand smoke , limiting alcohol to 2 beverages per day, no illicit drugs.   2. Risk factor reduction:  Advised patient of need for regular exercise and diet rich and fruits and vegetables to reduce  risk of heart attack and stroke.  Exercise- twice a week speed walking before kids get up. Gym doing resistance training. Aerodyne type bike Diet/weight management-stable from last year- encouraged mild gradual weight loss- eats reasonable foods- and could reduce portion or increase actvitity.  Wt Readings from Last 3 Encounters:  06/09/22 221 lb 12.8 oz (100.6 kg)  04/08/22 214 lb (97.1 kg)  03/04/22 218 lb 6.4 oz (99.1 kg)  3. Immunizations/screenings/ancillary studies- he will likely hold off on repeat covid shot after prior DVT  , consider fall flu shot Immunization History  Administered Date(s) Administered    Influenza Split 09/20/2012   Influenza,inj,Quad PF,6+ Mos 08/27/2017   Influenza-Unspecified 09/16/2011   PFIZER(Purple Top)SARS-COV-2 Vaccination 03/02/2020, 03/26/2020, 11/25/2020   Td 08/16/2009   Tdap 04/11/2021  4. Prostate cancer screening-  no family history, start at age 69 Lab Results  Component Value Date   PSA 0.83 08/23/2007   5. Colon cancer screening - cologuard 11/02/21 with 3 year repeat planned 6. Skin cancer screening/prevention- no dermatologist. advised regular sunscreen use. Denies worrisome, changing, or new skin lesions.  7. Testicular cancer screening- advised monthly self exams  8. STD screening- patient opts out- only active with wife 9. Smoking associated screening- Never smoker  Status of chronic or acute concerns   #LUE DVT history- finished 3 months therapy as of June 14th. Provoked by Covid. Dr. Truett Perna saw him and no further workup planned/needed  #hyperlipidemia- dad did have CAD at 69 (ate much worse than he does) S: Medication:none  -in past GXT normal. stress test Dr. Herbie Baltimore 01/2021. 04/10/21 had CT angio chest-no signs of coronary calcium or pulmonary embolism Lab Results  Component Value Date   CHOL 138 04/28/2021   HDL 29.90 (L) 04/28/2021   LDLCALC 94 04/12/2020   LDLDIRECT 85.0 04/28/2021   TRIG 212.0 (H) 04/28/2021   CHOLHDL 5 04/28/2021   A/P: ok to remain off statin with reassuring prior workup and no coaronary calcium- focus on healthy eating/regular exercise- update lipid panel  #supplements- magnesium and zinc and b12 and D3 and turmeric- not taking MV now. Off melatonin and doing tart cherry before bed- helps him sleep  #stopped cialis- had back pain and had a night with palpitations- also doing better with more exercise/activity  Recommended follow up: Return in about 1 year (around 06/10/2023) for physical or sooner if needed.Schedule b4 you leave.  Lab/Order associations: fasting   ICD-10-CM   1. Preventative health care   Z00.00     2. Hypertriglyceridemia  E78.1       No orders of the defined types were placed in this encounter.   Return precautions advised.   Tana Conch, MD

## 2022-06-09 NOTE — Patient Instructions (Addendum)
Please stop by lab before you go If you have mychart- we will send your results within 3 business days of [place] receiving them.  If you do not have mychart- we will call you about results within 5 business days of [place] receiving them.  *please also note that you will see labs on mychart as soon as they post. I will later go in and write notes on them- will say "notes from Dr. Durene Cal"   Lets work on gradual weight loss- even 5-10 lbs every 6 months is a good goal.   Recommended follow up: Return in about 1 year (around 06/10/2023) for physical or sooner if needed.Schedule b4 you leave.

## 2022-08-10 ENCOUNTER — Encounter: Payer: Self-pay | Admitting: *Deleted

## 2022-10-16 ENCOUNTER — Ambulatory Visit: Payer: 59 | Admitting: Family Medicine

## 2022-10-18 ENCOUNTER — Other Ambulatory Visit: Payer: Self-pay

## 2022-10-18 ENCOUNTER — Emergency Department (HOSPITAL_BASED_OUTPATIENT_CLINIC_OR_DEPARTMENT_OTHER)
Admission: EM | Admit: 2022-10-18 | Discharge: 2022-10-18 | Disposition: A | Discharge status: Home / Self Care | Payer: BC Managed Care – PPO | Attending: Emergency Medicine | Admitting: Emergency Medicine

## 2022-10-18 ENCOUNTER — Encounter (HOSPITAL_BASED_OUTPATIENT_CLINIC_OR_DEPARTMENT_OTHER): Payer: Self-pay

## 2022-10-18 DIAGNOSIS — J069 Acute upper respiratory infection, unspecified: Secondary | ICD-10-CM | POA: Insufficient documentation

## 2022-10-18 DIAGNOSIS — Z20822 Contact with and (suspected) exposure to covid-19: Secondary | ICD-10-CM | POA: Diagnosis not present

## 2022-10-18 DIAGNOSIS — B9789 Other viral agents as the cause of diseases classified elsewhere: Secondary | ICD-10-CM | POA: Diagnosis not present

## 2022-10-18 DIAGNOSIS — J029 Acute pharyngitis, unspecified: Secondary | ICD-10-CM | POA: Diagnosis not present

## 2022-10-18 LAB — RESP PANEL BY RT-PCR (FLU A&B, COVID) ARPGX2
Influenza A by PCR: NEGATIVE
Influenza B by PCR: NEGATIVE
SARS Coronavirus 2 by RT PCR: NEGATIVE

## 2022-10-18 LAB — GROUP A STREP BY PCR: Group A Strep by PCR: NOT DETECTED

## 2022-10-18 MED ORDER — CETIRIZINE HCL 10 MG PO TABS: 10.0000 mg | ORAL_TABLET | Freq: Every day | ORAL | 0 refills | Status: AC

## 2022-10-18 MED ORDER — PREDNISONE 20 MG PO TABS: ORAL_TABLET | ORAL | 0 refills | Status: DC

## 2022-10-18 MED ORDER — BENZONATATE 100 MG PO CAPS: 100.0000 mg | ORAL_CAPSULE | Freq: Three times a day (TID) | ORAL | 0 refills | Status: DC

## 2022-10-18 MED ORDER — GUAIFENESIN ER 1200 MG PO TB12: 1.0000 | ORAL_TABLET | Freq: Two times a day (BID) | ORAL | 0 refills | Status: AC

## 2022-10-18 NOTE — ED Provider Notes (Signed)
[facility] EMERGENCY DEPT Provider Note   CSN: [id number] Arrival date & time: 10/18/22  1045     History  Chief Complaint  Patient presents with   Sore Throat    Tanner [name] is a 51 y.o. male presenting to the ED with chief complaint of flulike symptoms over the last week.  Recently saw family for the holiday, 1 of which was diagnosed with the flu.  Began developing symptoms a few days after.  Endorses nasal congestion, postnasal drip, sore throat, mild dry cough, and occasional chills.  Cough appears persistent, and induced with deep inspiration.  Also endorses discomfort with swallowing though still able to keep down food and fluids.  Denies fever, stiff neck, chest pain, shortness of breath, N/V/D, or abdominal pain.  No urinary symptoms.  No Hx of asthma or lung disease.  No Hx of tobacco use.    The history is provided by the patient and medical records.  Sore Throat     Home Medications Prior to Admission medications   Medication Sig Start Date End Date Taking? Authorizing Provider  B Complex-C (B-COMPLEX WITH VITAMIN C) tablet Take 1 tablet by mouth daily.   Yes [provider]  benzonatate (TESSALON) 100 MG capsule Take 1 capsule (100 mg total) by mouth every 8 (eight) hours. 10/18/22  Yes Prince Rome, PA-C  cetirizine (ZYRTEC ALLERGY) 10 MG tablet Take 1 tablet (10 mg total) by mouth daily for 7 days. 10/18/22 10/25/22 Yes Prince Rome, PA-C  cholecalciferol (VITAMIN D3) 25 MCG (1000 UNIT) tablet Take 1,000 Units by mouth daily.   Yes [provider]  Guaifenesin (MUCINEX MAXIMUM STRENGTH) 1200 MG TB12 Take 1 tablet (1,200 mg total) by mouth every 12 (twelve) hours for 7 days. 10/18/22 10/25/22 Yes Prince Rome, PA-C  predniSONE (DELTASONE) 20 MG tablet Take 1 tablet (20 mg total) by mouth daily immediately after breakfast for 5 days. 10/18/22  Yes Prince Rome, PA-C      Allergies    Patient has no known  allergies.    Review of Systems   Review of Systems  HENT:  Positive for sore throat.     Physical Exam Updated Vital Signs BP (!) 127/94 (BP Location: Right Arm)   Pulse 79   Temp 98.6 F (37 C) (Oral)   Resp 16   Ht 5\' 8"  (1.727 m)   Wt 96.2 kg   SpO2 96%   BMI 32.23 kg/m  Physical Exam Vitals and nursing note reviewed.  Constitutional:      General: He is not in acute distress.    Appearance: He is well-developed. He is not ill-appearing, toxic-appearing or diaphoretic.  HENT:     Head: Normocephalic and atraumatic.     Right Ear: Ear canal normal. No drainage, swelling or tenderness. A middle ear effusion is present. No mastoid tenderness. Tympanic membrane is not erythematous.     Left Ear: Ear canal normal. No drainage, swelling or tenderness. A middle ear effusion is present. No mastoid tenderness. Tympanic membrane is not erythematous.     Nose: Congestion and rhinorrhea present.     Mouth/Throat:     Mouth: Mucous membranes are moist.     Pharynx: Oropharynx is clear. Uvula midline. Posterior oropharyngeal erythema present. No pharyngeal swelling, oropharyngeal exudate or uvula swelling.     Tonsils: No tonsillar exudate or tonsillar abscesses.  Eyes:     General: Lids are normal. Gaze aligned appropriately.     Conjunctiva/sclera:  Conjunctivae normal.  Neck:     Thyroid: No thyromegaly.     Comments: No meningismus or torticollis.  Mild cervical lymphadenopathy bilaterally.  No unilateral mass, exquisite tenderness, or swelling Cardiovascular:     Rate and Rhythm: Normal rate and regular rhythm.     Pulses:          Radial pulses are 2+ on the right side and 2+ on the left side.     Heart sounds: No murmur heard. Pulmonary:     Effort: Pulmonary effort is normal. No respiratory distress.     Breath sounds: Normal breath sounds. No stridor. No wheezing, rhonchi or rales.  Chest:     Chest wall: No tenderness.     Comments: CTAB, able to communicate without  difficulty, without increased respiratory effort.  Shallow breaths, cough appears triggered by deep inspiration. Abdominal:     General: There is no distension.     Palpations: Abdomen is soft.     Tenderness: There is no abdominal tenderness.  Musculoskeletal:        General: No swelling.     Cervical back: Neck supple.  Lymphadenopathy:     Cervical: Cervical adenopathy present.  Skin:    General: Skin is warm and dry.     Capillary Refill: Capillary refill takes less than 2 seconds.     Coloration: Skin is not pale.     Findings: No erythema.  Neurological:     Mental Status: He is alert.  Psychiatric:        Mood and Affect: Mood normal.     ED Results / Procedures / Treatments   Labs (all labs ordered are listed, but only abnormal results are displayed) Labs Reviewed  GROUP A STREP BY PCR  RESP PANEL BY RT-PCR (FLU A&B, COVID) [id number]    EKG None  Radiology No results found.  Procedures Procedures    Medications Ordered in ED Medications - No data to display  ED Course/ Medical Decision Making/ A&P                           Medical Decision Making  51 y.o. male presents to the ED for concern of Sore Throat   This involves an extensive number of treatment options, and is a complaint that carries with it a high risk of complications and morbidity.  The emergent differential diagnosis prior to evaluation includes, but is not limited to: Viral pharyngitis, strep pharyngitis  This is not an exhaustive differential.   Past Medical History / Co-morbidities / Social History: No PMHx Social Determinants of Health include: None  Additional History:  None  Lab Tests: I ordered, and personally interpreted labs.  The pertinent results include:   Flu, COVID, strep negative  Imaging Studies: None  ED Course: Pt well-appearing on exam.  Presenting with 7 days of URI symptoms.  Pt afebrile without tonsillar exudate, erythema, or swelling.  Low clinical  suspicion for strep pharyngitis and negative for group strep A.  Satting at 97-99% on room air.  Hemodynamically stable.  Fair air movement without wheezing or rales, though cough appears triggered by medium to deep inspiration.  No accessory muscle use, tripoding, drooling, nasal flaring, or retractions appreciated.  No Hx of asthma.  Clinical exam suggestive of nasal congestion with post-nasal drip.  Considered CXR however low suspicion for pneumonia at this time.  Presents with mild cervical lymphadenopathy and with some dysphagia.  Presentation still non-concerning  for PTA or RPA.  No trismus or uvula deviation.  Pt able to drink water in ED without difficulty with intact air way.   Clinical diagnosis of viral pharyngitis.  Mild ear effusions though likely related to congestion.  Low clinical suspicion for otitis media or otitis externa or mastoiditis at this time.  No abx indicated.  Pt does not appear dehydrated, but did discuss importance of water rehydration.  Recommended PCP follow up and conservative symptom management.  Steroid pack, mucinex, and decongestant sent to pharmacy.  Patient reports satisfaction with today's encounter.  Patient in NAD and in good condition at time of discharge.   Disposition: After consideration the patient's encounter today, I do not feel today's workup suggests an emergent condition requiring admission or immediate intervention beyond what has been performed at this time.  Safe for discharge; instructed to return immediately for worsening symptoms, change in symptoms or any other concerns.  I have reviewed the patients home medicines and have made adjustments as needed.  Discussed course of treatment with the patient, whom demonstrated understanding.  Patient in agreement and has no further questions.    This chart was dictated using voice recognition software.  Despite best efforts to proofread, errors can occur which can change the documentation  meaning.         Final Clinical Impression(s) / ED Diagnoses Final diagnoses:  Viral URI with cough    Rx / DC Orders ED Discharge Orders          Ordered    predniSONE (DELTASONE) 20 MG tablet        10/18/22 1352    cetirizine (ZYRTEC ALLERGY) 10 MG tablet  Daily        10/18/22 1352    Guaifenesin (MUCINEX MAXIMUM STRENGTH) 1200 MG TB12  Every 12 hours        10/18/22 1352    benzonatate (TESSALON) 100 MG capsule  Every 8 hours        10/18/22 1353              Prince Rome, PA-C [id number]    Pattricia Boss, MD 10/19/22 1114

## 2022-10-18 NOTE — ED Notes (Signed)
Discharge paperwork given and verbally understood. 

## 2022-10-18 NOTE — Discharge Instructions (Signed)
You were seen in the emergency department today for upper respiratory infection with cough.  We have tested you for covid, flu, and strep, which were negative.  However, it is still likely that your symptoms are related to a different viral illness.   Treatment is directed at relieving symptoms.  There is no cure for acute viral infections; it is usually best to let them run their course.  Symptoms usually last around 1-2 weeks, though cough may occasionally linger for up to 3-4 weeks.  As discussed, antibiotics are not effective with viral infections, because the infection is caused by a virus, not by bacteria.   Treatments may include:  Increased fluid intake. Sports drinks offer valuable electrolytes, sugars, and fluids.  Breathing heated mist or steam (vaporizer or shower).  Eating chicken soup or other clear broths, and maintaining good nutrition.  Getting plenty of rest.  Using lozenges, tea with honey, or warm salt water for cough relief/sore throat relief (Cepkaol or Halls lozenges are available over-the-counter) Increasing usage of your inhaler if you have asthma.   Take over-the-counter Benadryl, Zyrtec, or Claritin to decrease sinus secretions, with Mucinex to help break up remaining mucus Continue to alternate between Tylenol and ibuprofen for pain and fever control.  You may return to work 24 hours after your temperature has returned to normal.  Please follow up with your primary care doctor in 5-7 days for recheck of ongoing symptoms.  Return to emergency department for emergent changing or worsening of symptoms.

## 2022-10-18 NOTE — ED Triage Notes (Signed)
In for eval of sore throat x1 week with dry cough and fatigue. Pain moved to bilateral ears yesterday. Daughter had flu last week.

## 2022-10-29 ENCOUNTER — Encounter: Payer: Self-pay | Admitting: Family Medicine

## 2022-10-29 ENCOUNTER — Encounter: Payer: Self-pay | Admitting: *Deleted

## 2022-11-03 ENCOUNTER — Encounter: Payer: Self-pay | Admitting: Family Medicine

## 2022-11-03 ENCOUNTER — Ambulatory Visit: Payer: BC Managed Care – PPO | Admitting: Family Medicine

## 2022-11-03 VITALS — BP 114/60 | HR 73 | Temp 98.0°F | Ht 68.0 in | Wt 220.6 lb

## 2022-11-03 DIAGNOSIS — J208 Acute bronchitis due to other specified organisms: Secondary | ICD-10-CM

## 2022-11-03 DIAGNOSIS — B9689 Other specified bacterial agents as the cause of diseases classified elsewhere: Secondary | ICD-10-CM | POA: Diagnosis not present

## 2022-11-03 DIAGNOSIS — J9801 Acute bronchospasm: Secondary | ICD-10-CM

## 2022-11-03 LAB — POC COVID19 BINAXNOW: SARS Coronavirus 2 Ag: NEGATIVE

## 2022-11-03 MED ORDER — AZITHROMYCIN 250 MG PO TABS: ORAL_TABLET | ORAL | 0 refills | Status: DC

## 2022-11-03 MED ORDER — ALBUTEROL SULFATE HFA 108 (90 BASE) MCG/ACT IN AERS: 2.0000 | INHALATION_SPRAY | Freq: Four times a day (QID) | RESPIRATORY_TRACT | 0 refills | Status: DC | PRN

## 2022-11-03 MED ORDER — HYDROCODONE BIT-HOMATROP MBR 5-1.5 MG/5ML PO SOLN: 5.0000 mL | Freq: Three times a day (TID) | ORAL | 0 refills | Status: DC | PRN

## 2022-11-03 NOTE — Progress Notes (Signed)
Subjective  CC:  Chief Complaint  Patient presents with   Cough    Pt stated that he has had a cough for over a week now. Was tested about a week ago. Has not gotten better and Rt side of chest is hurting from all the coughing.     HPI: SUBJECTIVE:  Tanner [name] is a 51 y.o. male who complains of congestion, nasal blockage, post nasal drip, cough described as harsh, nonproductive, paroxysmal, and now associated with pain with coughing; can't sleep due to cough  and denies sinus, high fevers, SOB,  or significant GI symptoms. Started late November. Had ED visit in early December ... Has used multiple cough and cold meds and prednisone w/o relief. No h/o copd or asthma. Non smoker.  I reviewed ER labs and office notes.  And all treatments.  Assessment  1. Acute bacterial bronchitis   2. Bronchospasm      Plan  Discussion:  Treat for bacterial bronchitis due to prolonged course and worsening symptoms. Add albuterol and hycodan. Get chest xray given chest pain to ensure no mass/infiltrate or other. Education regarding differences between viral and bacterial infections and treatment options are discussed.  Supportive care measures are recommended.  We discussed the use of mucolytic's, decongestants, antihistamines and antitussives as needed.  Tylenol or Advil are recommended if needed.  Follow up: prn   Orders Placed This Encounter  Procedures   DG Chest 2 View   POC COVID-19   Meds ordered this encounter  Medications   albuterol (VENTOLIN HFA) 108 (90 Base) MCG/ACT inhaler    Sig: Inhale 2 puffs into the lungs every 6 (six) hours as needed for wheezing or shortness of breath.    Dispense:  1 each    Refill:  0   azithromycin (ZITHROMAX) 250 MG tablet    Sig: Take 2 tabs today, then 1 tab daily for 4 days    Dispense:  1 each    Refill:  0   HYDROcodone bit-homatropine (HYCODAN) 5-1.5 MG/5ML syrup    Sig: Take 5 mLs by mouth every 8 (eight) hours as needed for cough.     Dispense:  120 mL    Refill:  0      I reviewed the patients updated PMH, FH, and SocHx.  Social History: Patient  reports that he has never smoked. He has never used smokeless tobacco. He reports current alcohol use of about 6.0 standard drinks of alcohol per week. He reports that he does not use drugs.  Patient Active Problem List   Diagnosis Date Noted   Acute deep vein thrombosis (DVT) of left upper extremity (HCC) 01/26/2022   Exertional chest pain 04/12/2020   Degenerative disc disease, lumbar 05/24/2019   Erectile dysfunction 07/31/2014   Hypertriglyceridemia 10/06/2011   Onychomycosis 09/14/2008   Allergic rhinitis 08/29/2007   GERD 08/29/2007   CYST, PILONIDAL W/ABSCESS 08/29/2007    Review of Systems: Cardiovascular: negative for chest pain Respiratory: negative for SOB or hemoptysis Gastrointestinal: negative for abdominal pain Genitourinary: negative for dysuria or gross hematuria Current Meds  Medication Sig   albuterol (VENTOLIN HFA) 108 (90 Base) MCG/ACT inhaler Inhale 2 puffs into the lungs every 6 (six) hours as needed for wheezing or shortness of breath.   azithromycin (ZITHROMAX) 250 MG tablet Take 2 tabs today, then 1 tab daily for 4 days   HYDROcodone bit-homatropine (HYCODAN) 5-1.5 MG/5ML syrup Take 5 mLs by mouth every 8 (eight) hours as needed for cough.  Objective  Vitals: BP 114/60   Pulse 73   Temp 98 F (36.7 C)   Ht 5\' 8"  (1.727 m)   Wt 220 lb 9.6 oz (100.1 kg)   SpO2 95%   BMI 33.54 kg/m  General: no acute distress but paroxysmal coughing present.  Psych:  Alert and oriented, normal mood and affect HEENT:  Normocephalic, atraumatic, supple neck, moist mucous membranes, mildly erythematous pharynx without exudate, mild lymphadenopathy, supple neck Cardiovascular:  RRR without murmur. no edema Respiratory:  Good breath sounds bilaterally, coarse breath sounds w/ occ wheeze, precipitates coughing spell. No rales audible.  Skin:  Warm, no  rashes Neurologic:   Mental status is normal. normal gait No visits with results within 1 Day(s) from this visit.  Latest known visit with results is:  Admission on 10/18/2022, Discharged on 10/18/2022  Component Date Value Ref Range Status   Group A Strep by PCR 10/18/2022 NOT DETECTED  NOT DETECTED Final   SARS Coronavirus 2 by RT PCR 10/18/2022 NEGATIVE  NEGATIVE Final   Influenza A by PCR 10/18/2022 NEGATIVE  NEGATIVE Final   Influenza B by PCR 10/18/2022 NEGATIVE  NEGATIVE Final   Covid negative today in office.  Commons side effects, risks, benefits, and alternatives for medications and treatment plan prescribed today were discussed, and the patient expressed understanding of the given instructions. Patient is instructed to call or message via MyChart if he/she has any questions or concerns regarding our treatment plan. No barriers to understanding were identified. We discussed Red Flag symptoms and signs in detail. Patient expressed understanding regarding what to do in case of urgent or emergency type symptoms.  Medication list was reconciled, printed and provided to the patient in AVS. Patient instructions and summary information was reviewed with the patient as documented in the AVS. This note was prepared with assistance of Dragon voice recognition software. Occasional wrong-word or sound-a-like substitutions may have occurred due to the inherent limitations of voice recognition software

## 2022-11-03 NOTE — Patient Instructions (Signed)
Please follow up if symptoms do not improve or as needed.    Please go to our [facility] office to get your xrays done. You can walk in M-F between 8:30am- noon or 1pm - 5pm. Tell them you are there for xrays ordered by me. They will send me the results, then I will let you know the results with instructions.   Address: [address].  The Xray department is located in the basement.   How to Use a Metered Dose Inhaler  A metered dose inhaler (MDI) is a handheld device filled with medicine that must be breathed into the lungs (inhaled). The medicine is delivered by pushing down on a metal canister. This releases a preset amount of spray and mist through the mouth and into the lungs. Each MDI canister holds a certain number of doses (puffs). Using a spacer with a metered dose inhaler may be recommended to help get more medicine into the lungs. A spacer is a plastic tube that connects to the MDI on one end and has a mouthpiece on the other end. A spacer holds the medicine in the tube for a short time. This allows more medicine to be inhaled. The MDI can be used to deliver many kinds of inhaled medicines, including: Quick relief or rescue medicines, such as bronchodilators. Controller medicines, such as corticosteroids. What are the risks? If you do not use your inhaler correctly, medicine might not reach your lungs to help you breathe. If you do not have enough strength to push down the canister to make it spray, ask your health care provider for ways to help. The medicine in the MDI may cause side effects, such as: Mouth sores (thrush). Cough. Hoarseness. Shakiness. Headache. Supplies needed: A metered dose inhaler. A spacer, if recommended. How to use a metered dose inhaler without a spacer  Remove the cap from the inhaler. If you are using the inhaler for the first time, shake it for 5 seconds, turn it away from your face, then release 4 puffs into the air. This is called  priming. Shake the inhaler for 5 seconds. Position the inhaler so the top of the canister faces up. Put your index finger on the top of the medicine canister. Support the bottom of the inhaler with your thumb. Breathe out normally and as completely as possible, away from the inhaler. Either place the inhaler between your teeth and close your lips tightly around the mouthpiece, or hold the inhaler 1-2 inches (2.5-5 cm) away from your open mouth. Keep your tongue down out of the way. If you are unsure which technique to use, ask your health care provider. Press the canister down with your index finger to release the medicine. Inhale deeply and slowly through your mouth until your lungs are completely filled. Do not breathe in through your nose. Inhaling should take 4-6 seconds. Hold the medicine in your lungs for 5-10 seconds (10 seconds is best). This helps the medicine get into the small airways of your lungs. Remove the inhaler from your mouth, turn your head, and breathe out normally. Wait about 1 minute between puffs or as directed. Then repeat steps 3-10 until you have taken the number of puffs that your health care provider directed. Put the cap on the inhaler. If you are using a steroid inhaler, rinse your mouth with water, gargle, and spit out the water. Do not swallow the water. How to use a metered dose inhaler with a spacer  Remove the cap from the inhaler. If you are using the inhaler for the first time, shake it for 5 seconds, turn it away from your face, then release 4 puffs into the air. This is called priming. Shake the inhaler for 5 seconds. Place the open end of the spacer onto the inhaler mouthpiece. Position the inhaler so the top of the canister faces up and the spacer mouthpiece faces you. Put your index finger on the top of the medicine canister. Support the bottom of the inhaler and the spacer with your thumb. Breathe out normally and as completely as possible, away from  the spacer. Place the spacer between your teeth and close your lips tightly around it. Keep your tongue down out of the way. Press the canister down with your index finger to release the medicine, then inhale deeply and slowly through your mouth until your lungs are completely filled. Do not breathe in through your nose. Inhaling should take 4-6 seconds. Hold the medicine in your lungs for 5-10 seconds (10 seconds is best). This helps the medicine get into the small airways of your lungs. Remove the spacer from your mouth, turn your head, and breathe out normally. Wait about 1 minute between puffs or as directed. Then repeat steps 3-11 until you have taken the number of puffs that your health care provider directed. Remove the spacer from the inhaler and put the cap on the inhaler. If you are using a steroid inhaler, rinse your mouth with water, gargle, and spit out the water. Do not swallow the water. Follow these instructions at home: Caring for your MDI Store your inhaler at or near room temperature. A cold MDI will not work properly. Follow directions on the package insert for care and cleaning of your MDI and spacer. General instructions Take your inhaled medicine only as told by your health care provider. Do not use the inhaler more than directed by your health care provider. Refill your MDI with medicine before all the preset doses have been used. If your inhaler has a counter, check it to determine how full your MDI is. The number you see tells you how many doses are left. If your inhaler does not have a counter, ask your health care provider when you will need to refill it. Then write the refill date on a calendar or on your MDI canister. Keep in mind that you cannot tell when the medicine in an inhaler is empty by shaking it. You may feel or hear something in the canister even when the preset medicine doses have been used up. Keeping track of your dosages is important. Do not use any  products that contain nicotine or tobacco, such as cigarettes, e-cigarettes, and chewing tobacco. If you need help quitting, ask your health care provider. Keep all follow-up visits as told by your health care provider. This is important. Where to find more information Centers for Disease Control and Prevention: FootballExhibition.com.br American Lung Association: www.lung.org Contact a health care provider if: Symptoms are only partially relieved with your inhaler. You are having trouble using your inhaler. You have side effects from the medicine. You have chills or a fever. You have night sweats. There is blood in your thick saliva (phlegm). Get help right away if: You have dizziness. You have a fast heart rate. You have severe shortness of breath. You have difficulty breathing. These symptoms may represent a serious problem that is an emergency. Do not wait to see if the symptoms will go away.  Get medical help right away. Call your local emergency services (911 in the [place] Do not drive yourself to the hospital. Summary A metered dose inhaler is a handheld device for taking medicine that must be breathed into the lungs (inhaled). Take your inhaled medicine only as told by your health care provider. Do not use the inhaler more than directed by your health care provider. You cannot tell when the medicine is gone in an inhaler by shaking it. Refill it with medicine before all the preset doses have been used. Follow directions on the package insert for care and cleaning of your MDI and spacer. This information is not intended to replace advice given to you by your health care provider. Make sure you discuss any questions you have with your health care provider. Document Revised: 12/19/2019 Document Reviewed: 12/19/2019 Elsevier Patient Education  2023 [employer].

## 2022-11-06 ENCOUNTER — Ambulatory Visit (INDEPENDENT_AMBULATORY_CARE_PROVIDER_SITE_OTHER)
Admission: RE | Admit: 2022-11-06 | Discharge: 2022-11-06 | Disposition: A | Discharge status: Home / Self Care | Payer: BC Managed Care – PPO | Source: Ambulatory Visit | Attending: Family Medicine | Admitting: Family Medicine

## 2022-11-06 DIAGNOSIS — B9689 Other specified bacterial agents as the cause of diseases classified elsewhere: Secondary | ICD-10-CM

## 2022-11-06 DIAGNOSIS — J208 Acute bronchitis due to other specified organisms: Secondary | ICD-10-CM

## 2022-11-06 DIAGNOSIS — R059 Cough, unspecified: Secondary | ICD-10-CM | POA: Diagnosis not present

## 2023-06-14 ENCOUNTER — Encounter: Payer: 59 | Admitting: Family Medicine

## 2023-09-04 ENCOUNTER — Encounter: Payer: Self-pay | Admitting: Family Medicine

## 2023-09-06 NOTE — Telephone Encounter (Signed)
LVM informing pt to call back so I can get him scheduled with another provider since PCP on paternity leave.

## 2023-12-20 ENCOUNTER — Encounter: Payer: Self-pay | Admitting: Family Medicine

## 2023-12-20 ENCOUNTER — Ambulatory Visit (INDEPENDENT_AMBULATORY_CARE_PROVIDER_SITE_OTHER): Payer: BC Managed Care – PPO | Admitting: Family Medicine

## 2023-12-20 VITALS — BP 124/80 | HR 79 | Temp 97.6°F | Ht 68.0 in | Wt 229.6 lb

## 2023-12-20 DIAGNOSIS — E669 Obesity, unspecified: Secondary | ICD-10-CM

## 2023-12-20 DIAGNOSIS — Z0000 Encounter for general adult medical examination without abnormal findings: Secondary | ICD-10-CM | POA: Diagnosis not present

## 2023-12-20 DIAGNOSIS — Z131 Encounter for screening for diabetes mellitus: Secondary | ICD-10-CM

## 2023-12-20 DIAGNOSIS — Z13220 Encounter for screening for lipoid disorders: Secondary | ICD-10-CM

## 2023-12-20 DIAGNOSIS — Z130 Encounter for screening for diseases of the blood and blood-forming organs and certain disorders involving the immune mechanism: Secondary | ICD-10-CM

## 2023-12-20 NOTE — Progress Notes (Signed)
Phone: [phone]   Subjective:  Patient presents today for their annual physical. Chief complaint-noted.   See problem oriented charting- ROS- full  review of systems was completed and negative  Per full ROS sheet completed by patient- doesn't want to move after dinner- feels like he's aging  The following were reviewed and entered/updated in epic: Past Medical History:  Diagnosis Date   ALLERGIC RHINITIS 08/29/2007   BACK PAIN 01/08/2009   GASTROENTERITIS, ACUTE 01/08/2009   GERD 08/29/2007   HEARING LOSS 05/10/2008   ONYCHOMYCOSIS, TOENAILS 09/14/2008   Shingles outbreak 06/24/2011   SKIN LESION 10/28/2010   Patient Active Problem List   Diagnosis Date Noted   Acute deep vein thrombosis (DVT) of left upper extremity (HCC) 01/26/2022    Priority: High   Hypertriglyceridemia 10/06/2011    Priority: Medium    Degenerative disc disease, lumbar 05/24/2019    Priority: Low   Erectile dysfunction 07/31/2014    Priority: Low   Onychomycosis 09/14/2008    Priority: Low   Allergic rhinitis 08/29/2007    Priority: Low   GERD 08/29/2007    Priority: Low   CYST, PILONIDAL W/ABSCESS 08/29/2007    Priority: Low   Exertional chest pain 04/12/2020   Past Surgical History:  Procedure Laterality Date   pilonidal cystectomy      Family History  Problem Relation Age of Onset   Lung cancer Mother 51       Long-term smoker   Coronary artery disease Father 48       Diagnosis 67-had CABG along with valve replacement;. 77 at death- MI complications. father was smoker   Valvular heart disease Father        rheumatic fever- replaced   Depression Other     Medications- reviewed and updated Current Outpatient Medications  Medication Sig Dispense Refill   B Complex-C (B-COMPLEX WITH VITAMIN C) tablet Take 1 tablet by mouth daily.     cholecalciferol (VITAMIN D3) 25 MCG (1000 UNIT) tablet Take 1,000 Units by mouth daily.     cetirizine (ZYRTEC ALLERGY) 10 MG tablet Take 1 tablet (10  mg total) by mouth daily for 7 days. 7 tablet 0   No current facility-administered medications for this visit.    Allergies-reviewed and updated No Known Allergies  Social History   Social History Narrative   Married 2008 with 5 kids (almost 16, 14, 11, 8, 5 in 2025).       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   He also does walk on the treadmill.   Objective  Objective:  BP 124/80   Pulse 79   Temp 97.6 F (36.4 C)   Ht 5\' 8"  (1.727 m)   Wt 229 lb 9.6 oz (104.1 kg)   SpO2 96%   BMI 34.91 kg/m  Gen: NAD, resting comfortably HEENT: Mucous membranes are moist. Oropharynx normal Neck: no thyromegaly CV: RRR no murmurs rubs or gallops Lungs: CTAB no crackles, wheeze, rhonchi Abdomen: soft/nontender/nondistended/normal bowel sounds. No rebound or guarding.  Ext: trace edema Skin: warm, dry Neuro: grossly normal, moves all extremities, PERRLA Declines rectal and genitourinary exam   Assessment and Plan  53 y.o. male presenting for annual physical.  Health Maintenance counseling: 1. Anticipatory guidance: Patient counseled regarding regular dental exams -q6 months, eye exams -yearly,  avoiding smoking and second hand smoke , limiting alcohol to 2 beverages per day - 3 per weekend total in week, no illicit drugs .   2. Risk factor reduction:  Advised patient of need for regular exercise and diet rich and fruits and vegetables to reduce risk of heart attack and stroke.  Exercise- gets to gym at lunch about 2 days a week- stretching, cardio with treadmill or bike. Focuses more on lower body for weights Diet/weight management-weight up 8 pounds from last physical and had been as low as 212 in late 2023- 2-3 sodas a week- feels activity could help him get weight down- discussed myfitnesspal.  Wt  Readings from Last 3 Encounters:  12/20/23 229 lb 9.6 oz (104.1 kg)  11/03/22 220 lb 9.6 oz (100.1 kg)  10/18/22 212 lb (96.2 kg)  3. Immunizations/screenings/ancillary studies-after prior DVT he has opted to hold off on COVID shots, declines flu, Shingrix as well  Immunization History  Administered Date(s) Administered   Influenza Split 09/20/2012   Influenza,inj,Quad PF,6+ Mos 08/27/2017   Influenza-Unspecified 09/16/2011   PFIZER(Purple Top)SARS-COV-2 Vaccination 03/02/2020, 03/26/2020, 11/25/2020   Td 08/16/2009   Tdap 04/11/2021   4. Prostate cancer screening-  no family history, start at age 53  Lab Results  Component Value Date   PSA 0.83 08/23/2007   5. Colon cancer screening - Cologuard 11/02/2021 with 3 repeat planned-he can reach out to [place] toward year and if he does not hear from Cologuard directly . Also considering colonoscopy now 6. Skin cancer screening-no dermatologist. advised regular sunscreen use. Denies worrisome, changing, or new skin lesions.  7. Smoking associated screening (lung cancer screening, AAA screen 65-75, UA)-never smoker 8. STD screening -only active with wife  Status of chronic or acute concerns   # Screening hyperlipidemia S: Medication: Does not yet qualify for medication - Prior stress test March 2022 reassuring.  Also had CT angio of the chest 04/10/2021 with no signs of coronary artery calcium or pulmonary embolism -Father with coronary artery disease at age 25 Lab Results  Component Value Date   CHOL 186 06/09/2022   HDL 42.10 06/09/2022   LDLCALC 114 (H) 06/09/2022   LDLDIRECT 85.0 04/28/2021   TRIG 149.0 06/09/2022   CHOLHDL 4 06/09/2022  A/P: update lipids with labs- discussed healthy eating and regular exercise and will check arteriosclerotic cardiovascular disease risk   # History of left upper extremity DVT-completed 3 months of therapy in June 2023.  Provoked by COVID-19 per hematology  # History of onychomycosis-treated with  Lamisil in the past   # Medication side effect-on Cialis in the past had back pain and night of palpitations   Recommended follow up: Return in about 1 year (around 12/19/2024) for physical or sooner if needed.Schedule b4 you leave.  Lab/Order associations:NOT fasting   ICD-10-CM   1. Preventative health care  Z00.00     2. Screening for diabetes mellitus  Z13.1 Hemoglobin A1c    CANCELED: Hemoglobin A1c    3. Obesity (BMI 30-39.9)  E66.9 Hemoglobin A1c    CANCELED: Hemoglobin A1c    4. Screening for hyperlipidemia  Z13.220 Comprehensive metabolic panel    Lipid panel    CANCELED: Comprehensive metabolic panel    CANCELED: Lipid panel    5. Screening for deficiency anemia  Z13.0 CBC with Differential/Platelet    CANCELED: CBC with Differential/Platelet      No orders of the defined types were placed in this encounter.   Return precautions advised.  Jeannett Senior  Durene Cal, MD

## 2023-12-20 NOTE — Patient Instructions (Addendum)
I suggest myfitnesspal Use 0.5 pounds per week weight loss goal (or up to 1 pounds if needed) Set a reasonable goal such as 5-10 lbs and can reset goal once you reach the goal Do not connect your step counter to this- watch or phone Update me in 2-3 months with how you are doing  Schedule a lab visit at the check out desk within 2 weeks. Return for future fasting labs meaning nothing but water after midnight please. Ok to take your medications with water.    Recommended follow up: Return in about 1 year (around 12/19/2024) for physical or sooner if needed.Schedule b4 you leave.

## 2023-12-24 ENCOUNTER — Other Ambulatory Visit: Payer: BC Managed Care – PPO

## 2023-12-24 DIAGNOSIS — Z130 Encounter for screening for diseases of the blood and blood-forming organs and certain disorders involving the immune mechanism: Secondary | ICD-10-CM | POA: Diagnosis not present

## 2023-12-24 DIAGNOSIS — Z13220 Encounter for screening for lipoid disorders: Secondary | ICD-10-CM | POA: Diagnosis not present

## 2023-12-24 DIAGNOSIS — Z131 Encounter for screening for diabetes mellitus: Secondary | ICD-10-CM

## 2023-12-24 DIAGNOSIS — E669 Obesity, unspecified: Secondary | ICD-10-CM

## 2023-12-25 LAB — COMPREHENSIVE METABOLIC PANEL
AG Ratio: 1.7 (calc) (ref 1.0–2.5)
ALT: 36 U/L (ref 9–46)
AST: 29 U/L (ref 10–35)
Albumin: 4.2 g/dL (ref 3.6–5.1)
Alkaline phosphatase (APISO): 105 U/L (ref 35–144)
BUN: 12 mg/dL (ref 7–25)
CO2: 24 mmol/L (ref 20–32)
Calcium: 9 mg/dL (ref 8.6–10.3)
Chloride: 103 mmol/L (ref 98–110)
Creat: 0.92 mg/dL (ref 0.70–1.30)
Globulin: 2.5 g/dL (ref 1.9–3.7)
Glucose, Bld: 74 mg/dL (ref 65–99)
Potassium: 4 mmol/L (ref 3.5–5.3)
Sodium: 137 mmol/L (ref 135–146)
Total Bilirubin: 0.7 mg/dL (ref 0.2–1.2)
Total Protein: 6.7 g/dL (ref 6.1–8.1)

## 2023-12-25 LAB — CBC WITH DIFFERENTIAL/PLATELET
Absolute Lymphocytes: 2549 {cells}/uL (ref 850–3900)
Absolute Monocytes: 497 {cells}/uL (ref 200–950)
Basophils Absolute: 57 {cells}/uL (ref 0–200)
Basophils Relative: 0.8 %
Eosinophils Absolute: 241 {cells}/uL (ref 15–500)
Eosinophils Relative: 3.4 %
HCT: 46 % (ref 38.5–50.0)
Hemoglobin: 15.6 g/dL (ref 13.2–17.1)
MCH: 30.2 pg (ref 27.0–33.0)
MCHC: 33.9 g/dL (ref 32.0–36.0)
MCV: 89 fL (ref 80.0–100.0)
MPV: 11 fL (ref 7.5–12.5)
Monocytes Relative: 7 %
Neutro Abs: 3756 {cells}/uL (ref 1500–7800)
Neutrophils Relative %: 52.9 %
Platelets: 223 10*3/uL (ref 140–400)
RBC: 5.17 10*6/uL (ref 4.20–5.80)
RDW: 12.9 % (ref 11.0–15.0)
Total Lymphocyte: 35.9 %
WBC: 7.1 10*3/uL (ref 3.8–10.8)

## 2023-12-25 LAB — LIPID PANEL
Cholesterol: 178 mg/dL (ref <200)
HDL: 42 mg/dL (ref >=40)
LDL Cholesterol (Calc): 108 mg/dL — ABNORMAL HIGH
Non-HDL Cholesterol (Calc): 136 mg/dL — ABNORMAL HIGH (ref <130)
Total CHOL/HDL Ratio: 4.2 (calc) (ref <5.0)
Triglycerides: 167 mg/dL — ABNORMAL HIGH (ref <150)

## 2023-12-25 LAB — HEMOGLOBIN A1C
Hgb A1c MFr Bld: 5.1 %{Hb} (ref <5.7)
Mean Plasma Glucose: 100 mg/dL
eAG (mmol/L): 5.5 mmol/L

## 2023-12-27 ENCOUNTER — Encounter: Payer: Self-pay | Admitting: Family Medicine

## 2024-03-29 ENCOUNTER — Ambulatory Visit: Payer: Self-pay

## 2024-03-29 NOTE — Telephone Encounter (Signed)
  Chief Complaint: fatigue, especially after meals Symptoms: can't stay awake Frequency: months Pertinent Negatives: Patient denies CP, SOB Disposition: [] ED /[] Urgent Care (no appt availability in office) / [x] Appointment(In office/virtual)/ []  [facility]/ [] Home Care/ [] Refused Recommended Disposition /[] [facility]/ []  Follow-up with PCP Additional Notes: Pt states that after meals he gets a tingling feeling and then falls asleep. States this only occurs after meals. Pt also admits that he is having "head jerks", pt compares this to when he is falling asleep however it is occurring during ADLs. Pt states that in Feb he had AWV and everything was normal.   Copied from CRM [id number]. Topic: Clinical - Red Word Triage >> Mar 29, 2024 10:00 AM Luane Rumps D wrote: Red Word that prompted transfer to Nurse Triage: After he eats dinner he falls asleep, extreme fatigue/tingling, tingling in lower extremities. Reason for Disposition  [1] Fatigue (i.e., tires easily, decreased energy) AND [2] persists > 1 week  Answer Assessment - Initial Assessment Questions 1. DESCRIPTION: "Describe how you are feeling."     States that after dinner, pt states that he is falling asleep. Pt states this also occurs after other meals.  2. SEVERITY: "How bad is it?"  "Can you stand and walk?"   - MILD (0-3): Feels weak or tired, but does not interfere with work, school or normal activities.   - MODERATE (4-7): Able to stand and walk; weakness interferes with work, school, or normal activities.   - SEVERE (8-10): Unable to stand or walk; unable to do usual activities.     mild 3. ONSET: "When did these symptoms begin?" (e.g., hours, days, weeks, months)     Weeks to months 4. CAUSE: "What do you think is causing the weakness or fatigue?" (e.g., not drinking enough fluids, medical problem, trouble sleeping)     unsure 5. NEW MEDICINES:  "Have you started on any new medicines recently?" (e.g., opioid  pain medicines, benzodiazepines, muscle relaxants, antidepressants, antihistamines, neuroleptics, beta blockers)     denies 6. OTHER SYMPTOMS: "Do you have any other symptoms?" (e.g., chest pain, fever, cough, SOB, vomiting, diarrhea, bleeding, other areas of pain)     Tingling in lower extremities  Protocols used: Weakness (Generalized) and Fatigue-A-AH

## 2024-03-29 NOTE — Telephone Encounter (Signed)
 If he is hitting his head/passing out with these episodes I would recommend ASAP evaluation-if he is nodding off/sleeping as noted but not passing out or falling with this I am okay waiting until the 16th

## 2024-03-29 NOTE — Telephone Encounter (Signed)
 Left voicemail to call office back so I could ask him for clarification.

## 2024-03-31 ENCOUNTER — Encounter: Payer: Self-pay | Admitting: Family Medicine

## 2024-03-31 ENCOUNTER — Ambulatory Visit: Admitting: Family Medicine

## 2024-03-31 VITALS — BP 108/60 | HR 89 | Temp 97.8°F | Ht 68.0 in | Wt 229.8 lb

## 2024-03-31 DIAGNOSIS — G4719 Other hypersomnia: Secondary | ICD-10-CM

## 2024-03-31 DIAGNOSIS — R5383 Other fatigue: Secondary | ICD-10-CM

## 2024-03-31 NOTE — Progress Notes (Signed)
 Phone [phone] In person visit   Subjective:   Tanner [name] is a 53 y.o. year old very pleasant male patient who presents for/with See problem oriented charting Chief Complaint  Patient presents with   Fatigue    Pt c/o general fatigue after eating a heavy meal at dinner along with a beer and falls asleep sitting on the couch. This has been more frequent over the past 7 days    Past Medical History-  Patient Active Problem List   Diagnosis Date Noted   Acute deep vein thrombosis (DVT) of left upper extremity (HCC) 01/26/2022    Priority: High   Hypertriglyceridemia 10/06/2011    Priority: Medium    Degenerative disc disease, lumbar 05/24/2019    Priority: Low   Erectile dysfunction 07/31/2014    Priority: Low   Onychomycosis 09/14/2008    Priority: Low   Allergic rhinitis 08/29/2007    Priority: Low   GERD 08/29/2007    Priority: Low   CYST, PILONIDAL W/ABSCESS 08/29/2007    Priority: Low   Exertional chest pain 04/12/2020    Medications- reviewed and updated Current Outpatient Medications  Medication Sig Dispense Refill   B Complex-C (B-COMPLEX WITH VITAMIN C) tablet Take 1 tablet by mouth daily.     cholecalciferol (VITAMIN D3) 25 MCG (1000 UNIT) tablet Take 1,000 Units by mouth daily.     cetirizine  (ZYRTEC  ALLERGY) 10 MG tablet Take 1 tablet (10 mg total) by mouth daily for 7 days. 7 tablet 0   No current facility-administered medications for this visit.     Objective:  BP 108/60   Pulse 89   Temp 97.8 F (36.6 C)   Ht 5\' 8"  (1.727 m)   Wt 229 lb 12.8 oz (104.2 kg)   SpO2 96%   BMI 34.94 kg/m  Gen: NAD, resting comfortably CV: RRR no murmurs rubs or gallops Lungs: CTAB no crackles, wheeze, rhonchi Ext: trace edema Skin: warm, dry     Assessment and Plan   # Fatigue after meals S:A couple of months ago noted he started desiring to avoid beer with dinner- if he had a beer he would start having head jerks like he's very tired and would get a  brain fog. Had 2-3 nights falling asleep on couch around 7- family would have to wake him up by 11 to get him to bed. He doesn't want to fall asleep and would prefer to stay awake. This usually happens about an hour after eating  patient has noted fatigue and sleepiness after a heavy meal at dinner along with a beer- finds himself falling asleep on the couch. More frequent in the last week.   A/P: 53 year old male with postprandial fatigue about an hour afterwards. He is most concerned for postprandial blood sugar changes- hyper vs hypo. Recommended using glucometer- see avs- to monitor and see if contributing. Also advised checking blood pressure at same time. Last a1c and blood sugar normal -sounds like he has some daytime sleepiness at other times- concern for OSA with stopbang of 4 intermediate risk but he wants to hold off on testingfor that before we get glucose and blood pressure information back   Recommended follow up: Return for next already scheduled visit or sooner if needed. Future Appointments  Date Time Provider Department Center  12/20/2024  9:00 AM Almira Jaeger, MD [facility]    Lab/Order associations:   ICD-10-CM   1. Fatigue, unspecified type  R53.83     2.  Excessive daytime sleepiness  G47.19      Time Spent: 25 minutes of total time (4:40 PM- 5: 05 pm) was spent on the date of the encounter performing the following actions: briefchart review prior to seeing the patient, obtaining history, performing a medically necessary exam, counseling on the workup plan and potential cuses, placing orders, and documenting in our EHR.     Return precautions advised.  Clarisa Crooked, MD

## 2024-03-31 NOTE — Patient Instructions (Addendum)
 [employer] Pl[employer] Blood Glucose Monitoring System- [facility] only Worth checking sugar when you are feeling this way to make sure not either too high or too low (postprandial hypoglycemia). Id prefer between 80-180 on the sugar. Let me know after you check - could also check blood pressure to make sure not too low- could get blood pressure cuff at pharmacy- I like omron cuff series 3 or silver series- about $35- 40  We also discussed sleep apnea testing if no findings on the above  Recommended follow up: Return for next already scheduled visit or sooner if needed.

## 2024-11-12 ENCOUNTER — Encounter: Payer: Self-pay | Admitting: Family Medicine

## 2024-11-13 NOTE — Telephone Encounter (Signed)
 Candidate for cologuard?

## 2024-12-20 ENCOUNTER — Encounter: Payer: BC Managed Care – PPO | Admitting: Family Medicine

## 2025-03-15 ENCOUNTER — Encounter: Admitting: Family Medicine
